# Patient Record
Sex: Female | Born: 1990 | Race: Asian | Hispanic: No | Smoking: Never smoker
Health system: Southern US, Community
[De-identification: ages and names within clinical notes are randomized; demographics above are authoritative.]

## PROBLEM LIST (undated history)

## (undated) DIAGNOSIS — A150 Tuberculosis of lung: Secondary | ICD-10-CM

## (undated) HISTORY — PX: NO PAST SURGERIES: SHX2092

## (undated) HISTORY — DX: Tuberculosis of lung: A15.0

---

## 2011-11-19 DIAGNOSIS — A150 Tuberculosis of lung: Secondary | ICD-10-CM

## 2011-11-19 HISTORY — DX: Tuberculosis of lung: A15.0

## 2018-11-18 NOTE — L&D Delivery Note (Addendum)
OB/GYN Faculty Practice Delivery Note  Katie Whitney is a 28 y.o. G1P1001 s/p forceps assisted vaginal delivery at [redacted]w[redacted]d. She was admitted for PD IOL.   ROM: 15h 35m with clear fluid GBS Status:  Positive (08/06 0000), adequately treated with penicillin followed by ampicillin for chorioamnionitis treatment Maximum Maternal Temperature: 101.4 F    Labor Progress: . Patient arrived at fingertip dilation and was induced with misoprostol, a foley bulb, pitocin, and AROM. She was ruptured for approximately 18 hours and had been on pitocin for 29 hours by the time of delivery. Intrapartum she also developed chorioamnionitis and received ampicillin and gentamicin.  Delivery Date/Time: 07/15/2019 at 1757 Delivery: Patient pushed for approximately 4 hours and was able to achieve 3+ station of the BPD, LOA and EFW approximately 3300gm and pelvis felt adequate. R/B/A d/w her and forceps delivery offered to expedite delivery given amount of time pushing and with chorio. Patient was offered forceps assisted delivery vs cesarean and elected for forceps delivery. Patient provided verbal consent with iPad interpreter used. Anesthesia bolused her epidural. Dr. Ilda Basset then easily applied Simpson-Luikart forceps to infants head. Head delivered with one pull and subsequently shoulder and body delivered in usual fashion. Infant with spontaneous cry, placed on mother's abdomen, dried and stimulated. Cord clamped x 2 after 1-minute delay, and cut by Dr. Ilda Basset. Cord blood drawn. Placenta delivered spontaneously with gentle cord traction. Fundus firm with massage and Pitocin. Rectum, labia, perineum, vagina, and cervix inspected with 3a perineal laceration and repaired in usual fashion with 1-0 vicryl for the EAS repair and 1-0 and 2-0 vicryl used to repair the remaining laceration. End rectal exam negative Will give dose of Unasyn given 3rd degree repair.  Placenta: 3v, intact Complications: forceps assisted  delivery Lacerations: 3a and bilateral sulcal (small on the left) and approximately 10cm on the left EBL: 668 Analgesia: epidural   Infant: APGAR (1 MIN): 8   APGAR (5 MINS): 9    Weight: 3175gm  Augustin Coupe, MD/MPH OB/GYN Fellow, Faculty Practice   Agree with above. I was present for the entire procedure.   Durene Romans MD Attending Center for [employer] farm manager)

## 2019-01-07 ENCOUNTER — Other Ambulatory Visit (HOSPITAL_COMMUNITY): Payer: Self-pay | Admitting: Nurse Practitioner

## 2019-01-07 DIAGNOSIS — Z3A13 13 weeks gestation of pregnancy: Secondary | ICD-10-CM

## 2019-01-07 DIAGNOSIS — Z369 Encounter for antenatal screening, unspecified: Secondary | ICD-10-CM

## 2019-01-07 LAB — OB RESULTS CONSOLE RUBELLA ANTIBODY, IGM: Rubella: IMMUNE

## 2019-01-07 LAB — OB RESULTS CONSOLE GC/CHLAMYDIA
Chlamydia: NEGATIVE
Gonorrhea: NEGATIVE

## 2019-01-07 LAB — OB RESULTS CONSOLE HIV ANTIBODY (ROUTINE TESTING): HIV: NONREACTIVE

## 2019-01-07 LAB — OB RESULTS CONSOLE RPR: RPR: NONREACTIVE

## 2019-01-07 LAB — OB RESULTS CONSOLE ABO/RH: RH Type: POSITIVE

## 2019-01-07 LAB — OB RESULTS CONSOLE HEPATITIS B SURFACE ANTIGEN: Hepatitis B Surface Ag: NEGATIVE

## 2019-01-28 ENCOUNTER — Encounter (HOSPITAL_COMMUNITY): Payer: Self-pay | Admitting: *Deleted

## 2019-01-29 ENCOUNTER — Other Ambulatory Visit: Payer: Self-pay

## 2019-01-29 ENCOUNTER — Ambulatory Visit (HOSPITAL_COMMUNITY): Payer: Medicaid Other | Admitting: *Deleted

## 2019-01-29 ENCOUNTER — Other Ambulatory Visit (HOSPITAL_COMMUNITY): Payer: Self-pay | Admitting: *Deleted

## 2019-01-29 ENCOUNTER — Encounter (HOSPITAL_COMMUNITY): Payer: Self-pay

## 2019-01-29 ENCOUNTER — Ambulatory Visit (HOSPITAL_COMMUNITY)
Admission: RE | Admit: 2019-01-29 | Discharge: 2019-01-29 | Disposition: A | Discharge status: Home / Self Care | Payer: Medicaid Other | Source: Ambulatory Visit | Attending: Obstetrics and Gynecology | Admitting: Obstetrics and Gynecology

## 2019-01-29 ENCOUNTER — Other Ambulatory Visit (HOSPITAL_COMMUNITY): Payer: Self-pay | Admitting: Nurse Practitioner

## 2019-01-29 ENCOUNTER — Encounter (HOSPITAL_COMMUNITY): Payer: Self-pay | Admitting: *Deleted

## 2019-01-29 ENCOUNTER — Ambulatory Visit (HOSPITAL_COMMUNITY): Payer: Medicaid Other

## 2019-01-29 VITALS — BP 119/70 | HR 87 | Ht <= 58 in | Wt 181.0 lb

## 2019-01-29 DIAGNOSIS — Z363 Encounter for antenatal screening for malformations: Secondary | ICD-10-CM | POA: Diagnosis not present

## 2019-01-29 DIAGNOSIS — Z3A13 13 weeks gestation of pregnancy: Secondary | ICD-10-CM

## 2019-01-29 DIAGNOSIS — Z3A16 16 weeks gestation of pregnancy: Secondary | ICD-10-CM

## 2019-01-29 DIAGNOSIS — Z3689 Encounter for other specified antenatal screening: Secondary | ICD-10-CM

## 2019-01-29 DIAGNOSIS — O99211 Obesity complicating pregnancy, first trimester: Secondary | ICD-10-CM | POA: Diagnosis not present

## 2019-01-29 DIAGNOSIS — Z369 Encounter for antenatal screening, unspecified: Secondary | ICD-10-CM

## 2019-01-29 DIAGNOSIS — Z3687 Encounter for antenatal screening for uncertain dates: Secondary | ICD-10-CM | POA: Diagnosis not present

## 2019-01-29 DIAGNOSIS — Z362 Encounter for other antenatal screening follow-up: Secondary | ICD-10-CM

## 2019-02-26 ENCOUNTER — Ambulatory Visit (HOSPITAL_COMMUNITY): Payer: Medicaid Other

## 2019-03-26 ENCOUNTER — Ambulatory Visit (HOSPITAL_COMMUNITY): Payer: Medicaid Other

## 2019-03-26 ENCOUNTER — Encounter (HOSPITAL_COMMUNITY): Payer: Self-pay

## 2019-06-11 ENCOUNTER — Other Ambulatory Visit: Payer: Self-pay

## 2019-06-11 DIAGNOSIS — Z20822 Contact with and (suspected) exposure to covid-19: Secondary | ICD-10-CM

## 2019-06-15 LAB — NOVEL CORONAVIRUS, NAA: SARS-CoV-2, NAA: NOT DETECTED

## 2019-06-24 LAB — OB RESULTS CONSOLE GC/CHLAMYDIA
Chlamydia: NEGATIVE
Gonorrhea: NEGATIVE

## 2019-06-24 LAB — OB RESULTS CONSOLE GBS: GBS: POSITIVE

## 2019-07-09 ENCOUNTER — Other Ambulatory Visit (HOSPITAL_COMMUNITY): Payer: Self-pay | Admitting: Family

## 2019-07-09 DIAGNOSIS — O480 Post-term pregnancy: Secondary | ICD-10-CM

## 2019-07-12 ENCOUNTER — Encounter (HOSPITAL_COMMUNITY): Payer: Self-pay | Admitting: *Deleted

## 2019-07-12 ENCOUNTER — Telehealth (HOSPITAL_COMMUNITY): Payer: Self-pay | Admitting: *Deleted

## 2019-07-12 NOTE — Telephone Encounter (Signed)
Preadmission screen  

## 2019-07-13 ENCOUNTER — Ambulatory Visit (HOSPITAL_COMMUNITY): Payer: Medicaid Other | Admitting: *Deleted

## 2019-07-13 ENCOUNTER — Inpatient Hospital Stay (HOSPITAL_COMMUNITY)
Admission: AD | Admit: 2019-07-13 | Discharge: 2019-07-17 | DRG: 768 | Disposition: A | Discharge status: Home / Self Care | Payer: Medicaid Other | Attending: Family Medicine | Admitting: Family Medicine

## 2019-07-13 ENCOUNTER — Other Ambulatory Visit (HOSPITAL_COMMUNITY): Payer: Self-pay | Admitting: Family

## 2019-07-13 ENCOUNTER — Ambulatory Visit (HOSPITAL_COMMUNITY)
Admission: RE | Admit: 2019-07-13 | Discharge: 2019-07-13 | Disposition: A | Discharge status: Home / Self Care | Payer: Medicaid Other | Source: Ambulatory Visit | Attending: Obstetrics and Gynecology | Admitting: Obstetrics and Gynecology

## 2019-07-13 ENCOUNTER — Other Ambulatory Visit: Payer: Self-pay

## 2019-07-13 ENCOUNTER — Encounter (HOSPITAL_COMMUNITY): Payer: Self-pay

## 2019-07-13 DIAGNOSIS — Z8611 Personal history of tuberculosis: Secondary | ICD-10-CM | POA: Diagnosis not present

## 2019-07-13 DIAGNOSIS — O480 Post-term pregnancy: Secondary | ICD-10-CM

## 2019-07-13 DIAGNOSIS — Z3A40 40 weeks gestation of pregnancy: Secondary | ICD-10-CM

## 2019-07-13 DIAGNOSIS — O411230 Chorioamnionitis, third trimester, not applicable or unspecified: Secondary | ICD-10-CM | POA: Diagnosis present

## 2019-07-13 DIAGNOSIS — O99214 Obesity complicating childbirth: Secondary | ICD-10-CM | POA: Diagnosis present

## 2019-07-13 DIAGNOSIS — O411290 Chorioamnionitis, unspecified trimester, not applicable or unspecified: Secondary | ICD-10-CM | POA: Diagnosis not present

## 2019-07-13 DIAGNOSIS — O409XX0 Polyhydramnios, unspecified trimester, not applicable or unspecified: Secondary | ICD-10-CM

## 2019-07-13 DIAGNOSIS — O289 Unspecified abnormal findings on antenatal screening of mother: Secondary | ICD-10-CM | POA: Diagnosis not present

## 2019-07-13 DIAGNOSIS — O99824 Streptococcus B carrier state complicating childbirth: Secondary | ICD-10-CM | POA: Diagnosis present

## 2019-07-13 DIAGNOSIS — Z20828 Contact with and (suspected) exposure to other viral communicable diseases: Secondary | ICD-10-CM | POA: Diagnosis present

## 2019-07-13 LAB — TYPE AND SCREEN
ABO/RH(D): A POS
Antibody Screen: NEGATIVE

## 2019-07-13 LAB — ABO/RH: ABO/RH(D): A POS

## 2019-07-13 LAB — CBC
HCT: 32.5 % — ABNORMAL LOW (ref 36.0–46.0)
Hemoglobin: 10.6 g/dL — ABNORMAL LOW (ref 12.0–15.0)
MCH: 29.9 pg (ref 26.0–34.0)
MCHC: 32.6 g/dL (ref 30.0–36.0)
MCV: 91.8 fL (ref 80.0–100.0)
Platelets: 251 10*3/uL (ref 150–400)
RBC: 3.54 MIL/uL — ABNORMAL LOW (ref 3.87–5.11)
RDW: 13.4 % (ref 11.5–15.5)
WBC: 9.2 10*3/uL (ref 4.0–10.5)
nRBC: 0 % (ref 0.0–0.2)

## 2019-07-13 LAB — SARS CORONAVIRUS 2 (TAT 6-24 HRS): SARS Coronavirus 2: NEGATIVE

## 2019-07-13 MED ORDER — PENICILLIN G 3 MILLION UNITS IVPB - SIMPLE MED
3.0000 10*6.[IU] | INTRAVENOUS | Status: DC
  Administered 2019-07-13 – 2019-07-15 (×11): 3 10*6.[IU] via INTRAVENOUS
  Filled 2019-07-13 (×11): qty 100

## 2019-07-13 MED ORDER — OXYTOCIN BOLUS FROM INFUSION
500.0000 mL | Freq: Once | INTRAVENOUS | Status: AC
  Administered 2019-07-15: 18:00:00 500 mL via INTRAVENOUS

## 2019-07-13 MED ORDER — LACTATED RINGERS IV SOLN
INTRAVENOUS | Status: DC
  Administered 2019-07-13 – 2019-07-15 (×7): via INTRAVENOUS

## 2019-07-13 MED ORDER — TERBUTALINE SULFATE 1 MG/ML IJ SOLN: 0.2500 mg | Freq: Once | INTRAMUSCULAR | Status: DC | PRN

## 2019-07-13 MED ORDER — OXYCODONE-ACETAMINOPHEN 5-325 MG PO TABS: 2.0000 | ORAL_TABLET | ORAL | Status: DC | PRN

## 2019-07-13 MED ORDER — ONDANSETRON HCL 4 MG/2ML IJ SOLN: 4.0000 mg | Freq: Four times a day (QID) | INTRAMUSCULAR | Status: DC | PRN

## 2019-07-13 MED ORDER — ACETAMINOPHEN 325 MG PO TABS
650.0000 mg | ORAL_TABLET | ORAL | Status: DC | PRN
  Administered 2019-07-15: 650 mg via ORAL
  Filled 2019-07-13: qty 2

## 2019-07-13 MED ORDER — SODIUM CHLORIDE 0.9 % IV SOLN
5.0000 10*6.[IU] | Freq: Once | INTRAVENOUS | Status: AC
  Administered 2019-07-13: 16:00:00 5 10*6.[IU] via INTRAVENOUS
  Filled 2019-07-13: qty 5

## 2019-07-13 MED ORDER — LIDOCAINE HCL (PF) 1 % IJ SOLN
30.0000 mL | INTRAMUSCULAR | Status: AC | PRN
  Administered 2019-07-15: 18:00:00 30 mL via SUBCUTANEOUS
  Filled 2019-07-13: qty 30

## 2019-07-13 MED ORDER — OXYCODONE-ACETAMINOPHEN 5-325 MG PO TABS: 1.0000 | ORAL_TABLET | ORAL | Status: DC | PRN

## 2019-07-13 MED ORDER — LACTATED RINGERS IV SOLN
500.0000 mL | INTRAVENOUS | Status: DC | PRN
  Administered 2019-07-14: 18:00:00 500 mL via INTRAVENOUS
  Administered 2019-07-15 (×2): 1000 mL via INTRAVENOUS

## 2019-07-13 MED ORDER — SOD CITRATE-CITRIC ACID 500-334 MG/5ML PO SOLN: 30.0000 mL | ORAL | Status: DC | PRN

## 2019-07-13 MED ORDER — MISOPROSTOL 50MCG HALF TABLET
50.0000 ug | ORAL_TABLET | ORAL | Status: DC | PRN
  Administered 2019-07-13 – 2019-07-14 (×3): 50 ug via BUCCAL
  Filled 2019-07-13 (×3): qty 1

## 2019-07-13 MED ORDER — MISOPROSTOL 50MCG HALF TABLET
ORAL_TABLET | ORAL | Status: AC
  Administered 2019-07-13: 16:00:00 50 ug
  Filled 2019-07-13: qty 1

## 2019-07-13 MED ORDER — OXYTOCIN 40 UNITS IN NORMAL SALINE INFUSION - SIMPLE MED
2.5000 [IU]/h | INTRAVENOUS | Status: DC
  Administered 2019-07-15: 18:00:00 500 [IU]/h via INTRAVENOUS
  Filled 2019-07-13 (×2): qty 1000

## 2019-07-13 NOTE — Progress Notes (Signed)
Dr. Gertie Exon called report to Dr. Nehemiah Settle. Patient to be admitted for delivery.

## 2019-07-13 NOTE — Progress Notes (Signed)
Patient ID: Katie Whitney, female   DOB: [birth year], 28 y.o.   MRN: [id number] Katie Whitney is a 28 y.o. G1P0 at [redacted]w[redacted]d admitted for induction of labor due to postdates, BPP 8/10 (-2 for tone).  Subjective: Comfortable, no complaints  Objective: BP 132/85   Pulse (!) 106   Temp 98.5 F (36.9 C) (Oral)   Resp 16   Ht 5\' 1"  (1.549 m)   Wt 96 kg   LMP 10/29/2018 (Exact Date)   BMI 40.00 kg/m  No intake/output data recorded.  FHT:  FHR: 135 bpm, variability: moderate,  accelerations:  Present,  decelerations:  Absent UC:   q 2-75mins  SVE:   Dilation: Fingertip Effacement (%): Thick Station: -3 Exam by:: Dr. Mathis Dad  Labs: Lab Results  Component Value Date   WBC 9.2 07/13/2019   HGB 10.6 (L) 07/13/2019   HCT 32.5 (L) 07/13/2019   MCV 91.8 07/13/2019   PLT 251 07/13/2019    Assessment / Plan: IOL d/t postdates w/ BPP 8/10 (-2 for tone), s/p cytotec x1, almost due for next dose. Plan foley bulb when able  Labor: ripening phase Fetal Wellbeing:  Category I Pain Control:  n/a Pre-eclampsia: n/a I/D:  GBS+ PCN Anticipated MOD:  NSVD  [name] CNM, WHNP-BC 07/13/2019, 2120

## 2019-07-13 NOTE — H&P (Signed)
OBSTETRIC ADMISSION HISTORY AND PHYSICAL  Nepali interpreter used during this encounter.  Katie Whitney is a 28 y.o. female G1P0 with IUP at 5679w3d presenting for IOL secondary to post-dates and BPP 8/10 (no tone seen). She reports +FMs. No LOF, VB, blurry vision, headaches, peripheral edema, or RUQ pain. She plans on breastfeeding. She requests OCPs for birth control.  Dating: By first trimester US --->  Estimated Date of Delivery: 07/10/19  Sono:   @[redacted]w[redacted]d , CWD, normal anatomy, cephalic presentation, BPP 8/10  Prenatal History/Complications: Hx + PPD, treated in 2013 Hx COVID exposure, COVID neg 7/27  Past Medical History: Past Medical History:  Diagnosis Date  . TB (pulmonary tuberculosis)    Test was positive. Was treated    Past Surgical History: Past Surgical History:  Procedure Laterality Date  . NO PAST SURGERIES      Obstetrical History: OB History    Gravida  1   Para      Term      Preterm      AB      Living  0     SAB      TAB      Ectopic      Multiple      Live Births              Social History: Social History   Socioeconomic History  . Marital status: Unknown    Spouse name: Not on file  . Number of children: Not on file  . Years of education: Not on file  . Highest education level: Not on file  Occupational History  . Not on file  Social Needs  . Financial resource strain: Not on file  . Food insecurity    Worry: Not on file    Inability: Not on file  . Transportation needs    Medical: Not on file    Non-medical: Not on file  Tobacco Use  . Smoking status: Never Smoker  . Smokeless tobacco: Never Used  Substance and Sexual Activity  . Alcohol use: Never    Frequency: Never  . Drug use: Never  . Sexual activity: Not Currently  Lifestyle  . Physical activity    Days per week: Not on file    Minutes per session: Not on file  . Stress: Not on file  Relationships  . Social Musicianconnections    Talks on phone: Not on file     Gets together: Not on file    Attends religious service: Not on file    Active member of club or organization: Not on file    Attends meetings of clubs or organizations: Not on file    Relationship status: Not on file  Other Topics Concern  . Not on file  Social History Narrative  . Not on file    Family History: Family History  Problem Relation Age of Onset  . Cancer Mother     Allergies: No Known Allergies  Medications Prior to Admission  Medication Sig Dispense Refill Last Dose  . polyethylene glycol (MIRALAX / GLYCOLAX) 17 g packet Take 17 g by mouth daily.   Past Week at Unknown time  . Prenatal Vit-Fe Fumarate-FA (PRENATAL VITAMIN PO) Take by mouth.   07/12/2019 at Unknown time  . acetaminophen (TYLENOL) 325 MG tablet Take 650 mg by mouth every 6 (six) hours as needed.   More than a month at Unknown time     Review of Systems   All systems reviewed and  negative except as stated in HPI  Blood pressure 121/82, pulse (!) 121, temperature 98.5 F (36.9 C), temperature source Oral, resp. rate 16, height 5\' 1"  (1.549 m), weight 96 kg, last menstrual period 10/29/2018. General appearance: alert, cooperative and no distress Lungs: regular rate and effort Heart: regular rate  Abdomen: soft, non-tender Extremities: Homans sign is negative, no sign of DVT Presentation: cephalic Fetal monitoringBaseline: 140 bpm, Variability: Good {> 6 bpm), Accelerations: Reactive and Decelerations: Absent Uterine activityNone Dilation: Fingertip Effacement (%): Thick Station: -3 Exam by:: Dr. Mathis Dad   Prenatal labs: ABO, Rh:  A+ Antibody:  negative Rubella:  immune RPR:   negative HBsAg:   negative HIV:   nonreactive GBS:   positive DM screen negative  Prenatal Transfer Tool  Maternal Diabetes: No Genetic Screening: Normal Maternal Ultrasounds/Referrals: Normal Fetal Ultrasounds or other Referrals:  Other:  BPP 8/10 on 07/13/19 Maternal Substance Abuse:   No Significant Maternal Medications:  None Significant Maternal Lab Results: Group B Strep positive  Results for orders placed or performed during the hospital encounter of 07/13/19 (from the past 24 hour(s))  CBC   Collection Time: 07/13/19  3:54 PM  Result Value Ref Range   WBC 9.2 4.0 - 10.5 K/uL   RBC 3.54 (L) 3.87 - 5.11 MIL/uL   Hemoglobin 10.6 (L) 12.0 - 15.0 g/dL   HCT 32.5 (L) 36.0 - 46.0 %   MCV 91.8 80.0 - 100.0 fL   MCH 29.9 26.0 - 34.0 pg   MCHC 32.6 30.0 - 36.0 g/dL   RDW 13.4 11.5 - 15.5 %   Platelets 251 150 - 400 K/uL   nRBC 0.0 0.0 - 0.2 %    Patient Active Problem List   Diagnosis Date Noted  . Post term pregnancy over 40 weeks 07/13/2019    Assessment and Plan: Katie Whitney is a 28 y.o. G1P0 at [redacted]w[redacted]d here for IOL secondary to post-dates and BPP 8/10.  1. Labor: start with cytotec, consider Foley balloon/pitocin/AROM as needed, time appropriate cervical examinations 2. FWB: fetal status reassuring, continuous monitoring 3. Pain: epidural as needed 4. GBS: positive  Risks and benefits of induction were reviewed, including failure of method, prolonged labor, need for further intervention, risk of cesarean.  Patient and family seem to understand these risks and wish to proceed. Options of cytotec, foley bulb, AROM, and pitocin reviewed, with use of each discussed.  Anticipate cervical ripening, vaginal delivery. CS as appropriate  [name], DO  07/13/2019, 4:12 PM

## 2019-07-13 NOTE — Procedures (Signed)
Katie Whitney 02-14-1991 [redacted]w[redacted]d  Fetus A Non-Stress Test Interpretation for 07/13/19  Indication: Unsatisfactory BPP, postdates  Fetal Heart Rate A Mode: External Baseline Rate (A): 140 bpm Variability: Moderate Accelerations: 15 x 15 Decelerations: None Multiple birth?: No  Uterine Activity Mode: Palpation, Toco Contraction Frequency (min): Occas Contraction Quality: Mild Resting Tone Palpated: Relaxed Resting Time: Adequate  Interpretation (Fetal Testing) Nonstress Test Interpretation: Reactive Comments: EFM tracing reviewed by Dr. Gertie Exon

## 2019-07-14 LAB — RPR: RPR Ser Ql: NONREACTIVE

## 2019-07-14 MED ORDER — DIPHENHYDRAMINE HCL 50 MG/ML IJ SOLN: 12.5000 mg | INTRAMUSCULAR | Status: DC | PRN

## 2019-07-14 MED ORDER — FENTANYL CITRATE (PF) 100 MCG/2ML IJ SOLN
INTRAMUSCULAR | Status: AC
  Filled 2019-07-14: qty 2

## 2019-07-14 MED ORDER — PHENYLEPHRINE 40 MCG/ML (10ML) SYRINGE FOR IV PUSH (FOR BLOOD PRESSURE SUPPORT)
80.0000 ug | PREFILLED_SYRINGE | INTRAVENOUS | Status: DC | PRN
  Filled 2019-07-14: qty 10

## 2019-07-14 MED ORDER — EPHEDRINE 5 MG/ML INJ: 10.0000 mg | INTRAVENOUS | Status: DC | PRN

## 2019-07-14 MED ORDER — PHENYLEPHRINE 40 MCG/ML (10ML) SYRINGE FOR IV PUSH (FOR BLOOD PRESSURE SUPPORT): 80.0000 ug | PREFILLED_SYRINGE | INTRAVENOUS | Status: DC | PRN

## 2019-07-14 MED ORDER — OXYTOCIN 40 UNITS IN NORMAL SALINE INFUSION - SIMPLE MED
1.0000 m[IU]/min | INTRAVENOUS | Status: DC
  Administered 2019-07-14: 13:00:00 2 m[IU]/min via INTRAVENOUS

## 2019-07-14 MED ORDER — FENTANYL-BUPIVACAINE-NACL 0.5-0.125-0.9 MG/250ML-% EP SOLN
12.0000 mL/h | EPIDURAL | Status: DC | PRN
  Filled 2019-07-14: qty 250

## 2019-07-14 MED ORDER — LACTATED RINGERS IV SOLN: 500.0000 mL | Freq: Once | INTRAVENOUS | Status: DC

## 2019-07-14 MED ORDER — FENTANYL CITRATE (PF) 100 MCG/2ML IJ SOLN
100.0000 ug | INTRAMUSCULAR | Status: DC | PRN
  Administered 2019-07-14 – 2019-07-15 (×3): 100 ug via INTRAVENOUS
  Filled 2019-07-14 (×2): qty 2

## 2019-07-14 NOTE — Progress Notes (Signed)
Katie Whitney is a 28 y.o. G1P0 at [redacted]w[redacted]d by ultrasound admitted for induction of labor due to BPP 8/10 (no tone seen) Post dates. Due date 07/10/19  Subjective: Pt doing well, no new concerns.   Objective: BP 131/79   Pulse 100   Temp 98.7 F (37.1 C) (Oral)   Resp 16   Ht 5\' 1"  (1.549 m)   Wt 96 kg   LMP 10/29/2018 (Exact Date)   BMI 40.00 kg/m  No intake/output data recorded. No intake/output data recorded.  FHT:  FHR: 140 bpm, variability: moderate,  accelerations:  Present,  decelerations:  Absent UC:   regular, every 1.5-3 minutes SVE:   Dilation: 5.5 Effacement (%): 50 Station: -3 Exam by:: Epifanio Lesches, RNC  Labs: Lab Results  Component Value Date   WBC 9.2 07/13/2019   HGB 10.6 (L) 07/13/2019   HCT 32.5 (L) 07/13/2019   MCV 91.8 07/13/2019   PLT 251 07/13/2019    Assessment / Plan: Induction of labor due to postterm,  progressing well on pitocin  Labor: Progressing on Pitocin, will continue to increase then AROM Preeclampsia:  no signs or symptoms of toxicity Fetal Wellbeing:  Category I Pain Control:  IV pain meds I/D:  GBS positive  Anticipated MOD:  NSVD  Lattie Haw MD PGY-1, [facility]  07/14/2019, 9:54 PM

## 2019-07-14 NOTE — Progress Notes (Signed)
Patient ID: Katie Whitney, female   DOB: 1990-11-20, 28 y.o.   MRN: [id number] Katie Whitney is a 28 y.o. G1P0 at [redacted]w[redacted]d admitted for induction of labor due to bpp 8/10.  Subjective: cramping  Objective: BP 130/85   Pulse (!) 101   Temp 98.6 F (37 C) (Oral)   Resp 16   Ht 5\' 1"  (1.549 m)   Wt 96 kg   LMP 10/29/2018 (Exact Date)   BMI 40.00 kg/m  No intake/output data recorded.  FHT:  FHR: 135 bpm, variability: moderate,  accelerations:  Present,  decelerations:  Absent UC:   q 2-60mins  SVE:   Dilation: 1 Effacement (%): 40 Station: -3 Exam by:: Knute Neu, CNM  Cervical foley bulb inserted and inflated w/ 55ml LR w/o difficulty   Labs: Lab Results  Component Value Date   WBC 9.2 07/13/2019   HGB 10.6 (L) 07/13/2019   HCT 32.5 (L) 07/13/2019   MCV 91.8 07/13/2019   PLT 251 07/13/2019    Assessment / Plan: IOL d/t BPP 8/10, s/p cytotec x 3, cervical foley bulb placed, continue cytotec until foley bulb out, then plan pitocin per protocol  Labor: ripening phase Fetal Wellbeing:  Category I Pain Control:  n/a Pre-eclampsia: n/a I/D:  PCN for GBS+ Anticipated MOD:  NSVD  [name] CNM, WHNP-BC 07/14/2019, 6:09 AM

## 2019-07-15 ENCOUNTER — Encounter (HOSPITAL_COMMUNITY): Payer: Self-pay | Admitting: *Deleted

## 2019-07-15 ENCOUNTER — Other Ambulatory Visit (HOSPITAL_COMMUNITY)
Admission: RE | Admit: 2019-07-15 | Discharge: 2019-07-15 | Disposition: A | Discharge status: Home / Self Care | Payer: Medicaid Other | Source: Ambulatory Visit | Attending: Family Medicine | Admitting: Family Medicine

## 2019-07-15 ENCOUNTER — Inpatient Hospital Stay (HOSPITAL_COMMUNITY): Payer: Medicaid Other | Admitting: Anesthesiology

## 2019-07-15 DIAGNOSIS — Z3A40 40 weeks gestation of pregnancy: Secondary | ICD-10-CM

## 2019-07-15 DIAGNOSIS — O480 Post-term pregnancy: Secondary | ICD-10-CM

## 2019-07-15 DIAGNOSIS — O99824 Streptococcus B carrier state complicating childbirth: Secondary | ICD-10-CM

## 2019-07-15 DIAGNOSIS — O411290 Chorioamnionitis, unspecified trimester, not applicable or unspecified: Secondary | ICD-10-CM | POA: Diagnosis not present

## 2019-07-15 MED ORDER — COCONUT OIL OIL: 1.0000 "application " | TOPICAL_OIL | Status: DC | PRN

## 2019-07-15 MED ORDER — POLYETHYLENE GLYCOL 3350 17 G PO PACK
17.0000 g | PACK | Freq: Every day | ORAL | Status: DC
  Administered 2019-07-16 – 2019-07-17 (×2): 17 g via ORAL
  Filled 2019-07-15 (×2): qty 1

## 2019-07-15 MED ORDER — PRENATAL MULTIVITAMIN CH
1.0000 | ORAL_TABLET | Freq: Every day | ORAL | Status: DC
  Administered 2019-07-16 – 2019-07-17 (×2): 1 via ORAL
  Filled 2019-07-15 (×2): qty 1

## 2019-07-15 MED ORDER — BENZOCAINE-MENTHOL 20-0.5 % EX AERO: 1.0000 "application " | INHALATION_SPRAY | CUTANEOUS | Status: DC | PRN

## 2019-07-15 MED ORDER — SODIUM CHLORIDE 0.9 % IV SOLN
2.0000 g | Freq: Four times a day (QID) | INTRAVENOUS | Status: DC
  Administered 2019-07-15: 2 g via INTRAVENOUS
  Filled 2019-07-15 (×2): qty 2000

## 2019-07-15 MED ORDER — OXYCODONE HCL 5 MG PO TABS: 5.0000 mg | ORAL_TABLET | ORAL | Status: DC | PRN

## 2019-07-15 MED ORDER — DIPHENHYDRAMINE HCL 25 MG PO CAPS: 25.0000 mg | ORAL_CAPSULE | Freq: Four times a day (QID) | ORAL | Status: DC | PRN

## 2019-07-15 MED ORDER — GENTAMICIN SULFATE 40 MG/ML IJ SOLN
5.0000 mg/kg | Freq: Once | INTRAVENOUS | Status: AC
  Administered 2019-07-15: 15:00:00 340 mg via INTRAVENOUS
  Filled 2019-07-15: qty 8.5

## 2019-07-15 MED ORDER — MAGNESIUM HYDROXIDE 400 MG/5ML PO SUSP: 30.0000 mL | ORAL | Status: DC | PRN

## 2019-07-15 MED ORDER — LIDOCAINE-EPINEPHRINE (PF) 2 %-1:200000 IJ SOLN
INTRAMUSCULAR | Status: AC
  Filled 2019-07-15: qty 10

## 2019-07-15 MED ORDER — SODIUM CHLORIDE (PF) 0.9 % IJ SOLN
INTRAMUSCULAR | Status: DC | PRN
  Administered 2019-07-15: 12 mL/h via EPIDURAL

## 2019-07-15 MED ORDER — LIDOCAINE HCL (PF) 1 % IJ SOLN
INTRAMUSCULAR | Status: DC | PRN
  Administered 2019-07-15: 10 mL via EPIDURAL

## 2019-07-15 MED ORDER — SIMETHICONE 80 MG PO CHEW: 80.0000 mg | CHEWABLE_TABLET | ORAL | Status: DC | PRN

## 2019-07-15 MED ORDER — SODIUM CHLORIDE 0.9 % IV SOLN
3.0000 g | Freq: Once | INTRAVENOUS | Status: AC
  Administered 2019-07-15: 20:00:00 3 g via INTRAVENOUS
  Filled 2019-07-15: qty 8

## 2019-07-15 MED ORDER — ONDANSETRON HCL 4 MG/2ML IJ SOLN: 4.0000 mg | INTRAMUSCULAR | Status: DC | PRN

## 2019-07-15 MED ORDER — ONDANSETRON HCL 4 MG PO TABS: 4.0000 mg | ORAL_TABLET | ORAL | Status: DC | PRN

## 2019-07-15 MED ORDER — IBUPROFEN 600 MG PO TABS
600.0000 mg | ORAL_TABLET | Freq: Four times a day (QID) | ORAL | Status: DC
  Administered 2019-07-15 – 2019-07-17 (×7): 600 mg via ORAL
  Filled 2019-07-15 (×7): qty 1

## 2019-07-15 MED ORDER — OXYCODONE HCL 5 MG PO TABS: 10.0000 mg | ORAL_TABLET | ORAL | Status: DC | PRN

## 2019-07-15 MED ORDER — WITCH HAZEL-GLYCERIN EX PADS: 1.0000 "application " | MEDICATED_PAD | CUTANEOUS | Status: DC | PRN

## 2019-07-15 MED ORDER — SODIUM CHLORIDE 0.9 % IV SOLN: 1.0000 g | Freq: Four times a day (QID) | INTRAVENOUS | Status: DC

## 2019-07-15 MED ORDER — LIDOCAINE-EPINEPHRINE (PF) 2 %-1:200000 IJ SOLN
INTRAMUSCULAR | Status: DC | PRN
  Administered 2019-07-15: 5 mL via INTRADERMAL

## 2019-07-15 MED ORDER — DIBUCAINE (PERIANAL) 1 % EX OINT: 1.0000 "application " | TOPICAL_OINTMENT | CUTANEOUS | Status: DC | PRN

## 2019-07-15 MED ORDER — ACETAMINOPHEN 325 MG PO TABS: 650.0000 mg | ORAL_TABLET | ORAL | Status: DC | PRN

## 2019-07-15 MED ORDER — SENNOSIDES-DOCUSATE SODIUM 8.6-50 MG PO TABS
2.0000 | ORAL_TABLET | ORAL | Status: DC
  Administered 2019-07-15 – 2019-07-16 (×2): 2 via ORAL
  Filled 2019-07-15 (×2): qty 2

## 2019-07-15 NOTE — Progress Notes (Signed)
LABOR PROGRESS NOTE  Katie Whitney is a 28 y.o. G1P0000 at [redacted]w[redacted]d  admitted for PD IOL. Pregnancy complicated by n/a.   Subjective: Epidural working well Not feeling much  Objective: BP 127/74   Pulse (!) 119   Temp 98.7 F (37.1 C) (Oral)   Resp 18   Ht 5\' 1"  (1.549 m)   Wt 96 kg   LMP 10/29/2018 (Exact Date)   SpO2 100%   BMI 40.00 kg/m  or  Vitals:   07/15/19 0831 07/15/19 0901 07/15/19 0931 07/15/19 1001  BP: 115/70 108/63 129/73 127/74  Pulse: (!) 106 (!) 113 (!) 114 (!) 119  Resp: 20 20 18 18   Temp: 98.8 F (37.1 C)   98.7 F (37.1 C)  TempSrc: Oral   Oral  SpO2:      Weight:      Height:         Dilation: 9 Effacement (%): 100 Cervical Position: Anterior Station: Plus 1 Presentation: Vertex Exam by:: Curly [place], RN FHT: baseline rate 150, moderate varibility, +acel, -decel Toco: q3 min ctx  Labs: Lab Results  Component Value Date   WBC 9.2 07/13/2019   HGB 10.6 (L) 07/13/2019   HCT 32.5 (L) 07/13/2019   MCV 91.8 07/13/2019   PLT 251 07/13/2019    Patient Active Problem List   Diagnosis Date Noted  . Post term pregnancy over 40 weeks 07/13/2019    Assessment / Plan: 28 y.o. G1P0000 at [redacted]w[redacted]d here for PD IOL. Pregnancy complicated by n/a.  Labor: s/p Miso x4, FB, and on Pit since 8/26 @ 1300 (21 hours at this point). Progressing well, now 9 cm and 1+ station, cont pitocin. Fetal Wellbeing:  Cat I, reassuring Pain Control:  epidural Anticipated MOD:  SVD  Augustin Coupe, MD/MP[facility] OB Fellow  07/15/2019, 10:05 AM

## 2019-07-15 NOTE — Discharge Summary (Signed)
Postpartum Discharge Summary     Patient Name: Katie Whitney DOB: 11-06-1991 MRN: [id number]  Date of admission: 07/13/2019 Delivering Provider: Aletha Halim   Date of discharge: 07/17/2019  Admitting diagnosis: Induction Intrauterine pregnancy: [redacted]w[redacted]d     Secondary diagnosis:  Active Problems:   Post term pregnancy over 40 weeks   Chorioamnionitis   Type 3a perineal laceration during delivery   Forceps delivery  Additional problems:  Limited prenatal care Chorioamnionitis     Discharge diagnosis: Term Pregnancy Delivered                                                                                                Post partum procedures:N/A  Augmentation: AROM, Pitocin, Cytotec and Foley Balloon  Complications: N/A  Hospital course:  Induction of Labor With Vaginal Delivery   28 y.o. yo G1P1001 at [redacted]w[redacted]d was admitted to the hospital 07/13/2019 for induction of labor.  Indication for induction: Postdates.  Patient's labor induced with misoprostol, foley balloon, pitocin, and AROM. Labor complicated by chorioamnionitis for which she received antibiotics. Patient pushed for 4.5 hours reaching 3+station, at which she was offered forceps delivery which was successful. Membrane Rupture Time/Date: 2:23 AM ,07/15/2019   Intrapartum Procedures: Episiotomy: None [1]                                         Lacerations:  3rd degree [4]  Patient had delivery of a Viable infant.  Information for the patient's newborn:  Portia, Wisdom [[id number]]  Delivery Method: Vaginal, Forceps(Filed from Delivery Summary)    07/15/2019  Details of delivery can be found in separate delivery note.  Patient had a routine postpartum course. Patient is discharged home 07/15/19.  Magnesium Sulfate recieved: No BMZ received: No  Physical exam  Vitals:   07/15/19 1831 07/15/19 1846 07/15/19 1901 07/15/19 1917  BP: 117/63 112/63 120/63 (!) 66/41  Pulse: 81 83 92 92  Resp: 20 18 18 20   Temp: (!)  100.5 F (38.1 C)     TempSrc: Axillary     SpO2:      Weight:      Height:       General: alert, cooperative and no distress Lochia: appropriate Uterine Fundus: firm Incision: N/A DVT Evaluation: No evidence of DVT seen on physical exam. Labs: Lab Results  Component Value Date   WBC 9.2 07/13/2019   HGB 10.6 (L) 07/13/2019   HCT 32.5 (L) 07/13/2019   MCV 91.8 07/13/2019   PLT 251 07/13/2019   No flowsheet data found.  Discharge instruction: per After Visit Summary and "Baby and Me Booklet".  After visit meds:  Allergies as of 07/17/2019   No Known Allergies     Medication List    TAKE these medications   acetaminophen 325 MG tablet Commonly known as: Tylenol Take 2 tablets (650 mg total) by mouth every 4 (four) hours as needed for mild pain (for pain scale < 4). What changed:   when to take this  reasons to take this   benzocaine-Menthol 20-0.5 % Aero Commonly known as: DERMOPLAST Apply 1 application topically as needed for irritation (perineal discomfort).   dibucaine 1 % Oint Commonly known as: NUPERCAINAL Place 1 application rectally as needed for hemorrhoids.   magnesium hydroxide 400 MG/5ML suspension Commonly known as: MILK OF MAGNESIA Take 30 mLs by mouth every three (3) days as needed for mild constipation (if no BM).   polyethylene glycol 17 g packet Commonly known as: MIRALAX / GLYCOLAX Take 17 g by mouth daily.   PRENATAL VITAMIN PO Take by mouth.   senna-docusate 8.6-50 MG tablet Commonly known as: Senokot-S Take 2 tablets by mouth daily. Start taking on: July 18, 2019   simethicone 80 MG chewable tablet Commonly known as: MYLICON Chew 1 tablet (80 mg total) by mouth as needed for flatulence.   witch hazel-glycerin pad Commonly known as: TUCKS Apply 1 application topically as needed for hemorrhoids.       Diet: routine diet  Activity: Advance as tolerated. Pelvic rest for 6 weeks.   Outpatient follow up:2 weeks Follow up  Appt:No future appointments. Follow up Visit:   Please schedule this patient for Postpartum visit in: 2 weeks with the following provider: Dr. Vergie LivingPickens, 3rd degree perineal laceration For C/S patients schedule nurse incision check in weeks 2 weeks: no Low risk pregnancy complicated by: n/a Delivery mode:  Forceps assisted delivery Anticipated Birth Control:  OCPs PP Procedures needed: follow up perineal 3rd degree  Schedule Integrated BH visit: no  Newborn Data: Live born female  Birth Weight:   APGAR: 8, 9  Newborn Delivery   Birth date/time: 07/15/2019 17:57:00 Delivery type: Vaginal, Forceps      Baby Feeding: Breast Disposition:home with mother  Language barrier: Nepali iPad interpreter used for my interaction with patient today  Clayton BiblesSamantha Navin Dogan, MSN, CNM Certified Nurse Midwife, [facility], [facility] 07/17/19 10:20 AM

## 2019-07-15 NOTE — Anesthesia Procedure Notes (Signed)
Epidural Patient location during procedure: OB Start time: 07/15/2019 4:03 AM End time: 07/15/2019 4:15 AM  Staffing Anesthesiologist: Lidia Collum, MD Performed: anesthesiologist   Preanesthetic Checklist Completed: patient identified, pre-op evaluation, timeout performed, IV checked, risks and benefits discussed and monitors and equipment checked  Epidural Patient position: sitting Prep: DuraPrep Patient monitoring: heart rate, continuous pulse ox and blood pressure Approach: midline Location: L3-L4 Injection technique: LOR air  Needle:  Needle type: Tuohy  Needle gauge: 17 G Needle length: 9 cm Needle insertion depth: 6 cm Catheter type: closed end flexible Catheter size: 19 Gauge Catheter at skin depth: 11 cm Test dose: negative  Assessment Events: blood not aspirated, injection not painful, no injection resistance, negative IV test and no paresthesia  Additional Notes Reason for block:procedure for pain

## 2019-07-15 NOTE — Anesthesia Preprocedure Evaluation (Signed)
Anesthesia Evaluation  Patient identified by MRN, date of birth, ID band Patient awake    Reviewed: Allergy & Precautions, H&P , NPO status , Patient's Chart, lab work & pertinent test results  History of Anesthesia Complications Negative for: history of anesthetic complications  Airway Mallampati: II  TM Distance: >3 FB Neck ROM: full    Dental no notable dental hx.    Pulmonary neg pulmonary ROS,    Pulmonary exam normal        Cardiovascular negative cardio ROS Normal cardiovascular exam Rhythm:regular Rate:Normal     Neuro/Psych negative neurological ROS  negative psych ROS   GI/Hepatic negative GI ROS, Neg liver ROS,   Endo/Other  Morbid obesity  Renal/GU negative Renal ROS  negative genitourinary   Musculoskeletal   Abdominal   Peds  Hematology negative hematology ROS (+)   Anesthesia Other Findings   Reproductive/Obstetrics (+) Pregnancy                             Anesthesia Physical Anesthesia Plan  ASA: III  Anesthesia Plan: Epidural   Post-op Pain Management:    Induction:   PONV Risk Score and Plan:   Airway Management Planned:   Additional Equipment:   Intra-op Plan:   Post-operative Plan:   Informed Consent: I have reviewed the patients History and Physical, chart, labs and discussed the procedure including the risks, benefits and alternatives for the proposed anesthesia with the patient or authorized representative who has indicated his/her understanding and acceptance.       Plan Discussed with:   Anesthesia Plan Comments:         Anesthesia Quick Evaluation  

## 2019-07-15 NOTE — Progress Notes (Signed)
L&D Note  07/15/2019 - 4:46 PM  28 y.o. G1P0000 956w5d. Pregnancy complicated by GBS pos  Patient Active Problem List   Diagnosis Date Noted  . Chorioamnionitis 07/15/2019  . Post term pregnancy over 40 weeks 07/13/2019    Katie Whitney is admitted for post dates IOL   Subjective:  Feeling more pain and pressure and feels UCs now  Objective:    Current Vital Signs 24h Vital Sign Ranges  T 99.5 F (37.5 C) Temp  Avg: 99 F (37.2 C)  Min: 98.1 F (36.7 C)  Max: 100.8 F (38.2 C)  BP 123/67 BP  Min: 108/63  Max: 134/77  HR 94 Pulse  Avg: 104  Min: 84  Max: 120  RR 18 Resp  Avg: 18.5  Min: 16  Max: 22  SaO2 100 %   SpO2  Avg: 97.7 %  Min: 94 %  Max: 100 %       24 Hour I/O Current Shift I/O  Time Ins Outs No intake/output data recorded. 08/27 0701 - 08/27 1900 In: -  Out: 100 [Urine:100]   FHR: 160-165 baseline, no accels, +early decels, min mod variability,  Toco: q4-5239m Gen: NAD SVE: complete BPD to +2 and +3 with pushing  Labs:  Recent Labs  Lab 07/13/19 1554  WBC 9.2  HGB 10.6*  HCT 32.5*  PLT 251    Medications Current Facility-Administered Medications  Medication Dose Route Frequency Provider Last Rate Last Dose  . acetaminophen (TYLENOL) tablet 650 mg  650 mg Oral Q4H PRN Sparacino, Hailey L, DO   650 mg at 07/15/19 1555  . ampicillin (OMNIPEN) 2 g in sodium chloride 0.9 % 100 mL IVPB  2 g Intravenous Q6H Venora MaplesEckstat, Matthew M, MD 300 mL/hr at 07/15/19 1427 2 g at 07/15/19 1427  . diphenhydrAMINE (BENADRYL) injection 12.5 mg  12.5 mg Intravenous Q15 min PRN Cecile Hearingurk, Stephen Edward, MD      . ePHEDrine injection 10 mg  10 mg Intravenous PRN Cecile Hearingurk, Stephen Edward, MD      . ePHEDrine injection 10 mg  10 mg Intravenous PRN Cecile Hearingurk, Stephen Edward, MD      . fentaNYL (SUBLIMAZE) injection 100 mcg  100 mcg Intravenous Q1H PRN Cheral MarkerBooker, Kimberly R, CNM   100 mcg at 07/15/19 0249  . fentaNYL 2 mcg/mL w/ bupivacaine 0.125% in NS 250 mL epidural infusion ([facility]ANES)  12  mL/hr Epidural Continuous PRN Cecile Hearingurk, Stephen Edward, MD      . lactated ringers infusion 500 mL  500 mL Intravenous Once Cecile Hearingurk, Stephen Edward, MD      . lactated ringers infusion 500-1,000 mL  500-1,000 mL Intravenous PRN Sparacino, Hailey L, DO 999 mL/hr at 07/15/19 1259 1,000 mL at 07/15/19 1259  . lactated ringers infusion   Intravenous Continuous Sparacino, Hailey L, DO 125 mL/hr at 07/15/19 1250    . lidocaine (PF) (XYLOCAINE) 1 % injection 30 mL  30 mL Subcutaneous PRN Sparacino, Hailey L, DO      . misoprostol (CYTOTEC) tablet 50 mcg  50 mcg Buccal Q4H PRN Sparacino, Hailey L, DO   50 mcg at 07/14/19 0610  . ondansetron (ZOFRAN) injection 4 mg  4 mg Intravenous Q6H PRN Sparacino, Hailey L, DO      . oxyCODONE-acetaminophen (PERCOCET/ROXICET) 5-325 MG per tablet 1 tablet  1 tablet Oral Q4H PRN Sparacino, Hailey L, DO      . oxyCODONE-acetaminophen (PERCOCET/ROXICET) 5-325 MG per tablet 2 tablet  2 tablet Oral Q4H PRN Sparacino, Hailey L,  DO      . oxytocin (PITOCIN) IV BOLUS FROM BAG  500 mL Intravenous Once Sparacino, Hailey L, DO      . oxytocin (PITOCIN) IV infusion 40 units in NS 1000 mL - Premix  2.5 Units/hr Intravenous Continuous Sparacino, Hailey L, DO      . oxytocin (PITOCIN) IV infusion 40 units in NS 1000 mL - Premix  1-40 milli-units/min Intravenous Titrated Clarnce Flock, MD 42 mL/hr at 07/15/19 1544 28 milli-units/min at 07/15/19 1544  . PHENYLephrine 40 mcg/ml in normal saline Adult IV Push Syringe  80 mcg Intravenous PRN Catalina Gravel, MD      . PHENYLephrine 40 mcg/ml in normal saline Adult IV Push Syringe  80 mcg Intravenous PRN Catalina Gravel, MD      . sodium citrate-citric acid (ORACIT) solution 30 mL  30 mL Oral Q2H PRN Sparacino, Hailey L, DO      . terbutaline (BRETHINE) injection 0.25 mg  0.25 mg Subcutaneous Once PRN Sparacino, Hailey L, DO      . terbutaline (BRETHINE) injection 0.25 mg  0.25 mg Subcutaneous Once PRN Sparacino, Hailey L, DO        Facility-Administered Medications Ordered in Other Encounters  Medication Dose Route Frequency Provider Last Rate Last Dose  . fentaNYL (SUBLIMAZE) 500 mcg, bupivacaine (SENSORCAINE-MPF) 41.67 mL in sodium chloride (PF) 0.9 % 250 mL epidural    Continuous PRN Lidia Collum, MD 12 mL/hr at 07/15/19 0415 12 mL/hr at 07/15/19 0415  . lidocaine (PF) (XYLOCAINE) 1 % injection    Anesthesia Intra-op Lidia Collum, MD   10 mL at 07/15/19 0411    Assessment & Plan:  Pt stable *IUP: fetal status reassuring and improving status post abx, ivf bolus *Labor: continue with pushing and reassess in 30-91m if continues to bring down lower, can offer operative vag delivery.  IUPD in place *Chorio: continue amp/gent *GBS pos: can d/c pcn *Foley: follow up post delivery. Unable to I/O cath due to fetal head being so low.  -s/p traumatic accidental removal due to it being taped too low *Analgesia: d/w her try and hold off on using epidural button  Durene Romans MD Attending Center for [facility])

## 2019-07-15 NOTE — Progress Notes (Signed)
LABOR PROGRESS NOTE  Katie Whitney is a 28 y.o. G1P0000 at [redacted]w[redacted]d  admitted for PD IOL. Pregnancy complicated by n/a.   Subjective: Feeling more pressure with contractions  Objective: BP 127/73   Pulse (!) 119   Temp (!) 100.8 F (38.2 C) (Axillary)   Resp 18   Ht 5\' 1"  (1.549 m)   Wt 96 kg   LMP 10/29/2018 (Exact Date)   SpO2 100%   BMI 40.00 kg/m  or  Vitals:   07/15/19 1228 07/15/19 1231 07/15/19 1301 07/15/19 1331  BP:  127/78 129/70 127/73  Pulse:  (!) 110 (!) 102 (!) 119  Resp:  20 18 18   Temp: (!) 100.8 F (38.2 C)     TempSrc: Axillary     SpO2:      Weight:      Height:         Dilation: 10 Dilation Complete Date: 07/15/19 Dilation Complete Time: 1307 Effacement (%): 100 Cervical Position: Anterior Station: Plus 2, Plus 3 Presentation: Vertex Exam by:: Dr. Dione Plover FHT: baseline rate 160, minimal varibility, +acel, early and variable decel Toco: q3-4 min ctx  Labs: Lab Results  Component Value Date   WBC 9.2 07/13/2019   HGB 10.6 (L) 07/13/2019   HCT 32.5 (L) 07/13/2019   MCV 91.8 07/13/2019   PLT 251 07/13/2019    Patient Active Problem List   Diagnosis Date Noted  . Post term pregnancy over 40 weeks 07/13/2019    Assessment / Plan: 28 y.o. G1P0000 at [redacted]w[redacted]d here for PD IOL. Pregnancy complicated by n/a.  Labor: s/p Miso x4, FB, and on Pit since 8/26 @ 1300. Now fully and pushing and making descent however with borderline temp and elevated axillary temp, suspect developing chorioamnionitis, will start amp/gent and give tylenol.  Fetal Wellbeing:  Cat II, but with accels, monitor for labor progress closely Pain Control:  epidural Anticipated MOD:  SVD  Augustin Coupe, MD/[facility] OB Fellow  07/15/2019, 1:49 PM

## 2019-07-15 NOTE — Progress Notes (Signed)
Pharmacy Antibiotic Note  Katie Whitney is a 28 y.o. female admitted on 07/13/2019 at [redacted]w[redacted]d gestation & with low grade temp, possible chorioamnionitis. Pt is currently in labor.  Pharmacy has been consulted for Gentamicin dosing.  Plan: Gentamicin 340mg  IV (5mg /kg) x 1 F/u if needed beyond 24 hours.  Height: 5\' 1"  (154.9 cm) Weight: 211 lb 11.2 oz (96 kg) IBW/kg (Calculated) : 47.8  Temp (24hrs), Avg:98.9 F (37.2 C), Min:98 F (36.7 C), Max:100.8 F (38.2 C)  Recent Labs  Lab 07/13/19 1554  WBC 9.2    CrCl cannot be calculated (No successful lab value found.).    No Known Allergies  Antimicrobials this admission: 07/15/19 Ampicillin 2g IV Q 6 hr >>    Thank you for allowing pharmacy to be a part of this patient's care.  Hovey-Rankin, Tamesha Ellerbrock 07/15/2019 2:30 PM

## 2019-07-15 NOTE — Progress Notes (Signed)
Patient ID: Katie Whitney, female   DOB: [birth year], 28 y.o.   MRN: [id number] Late entry epic downtime  AROM clear fluid Vitals:   07/15/19 0455 07/15/19 0459 07/15/19 0500 07/15/19 0530  BP:   121/75 120/74  Pulse:   98 (!) 103  Resp:   18 18  Temp:  98.1 F (36.7 C)    TempSrc:  Oral    SpO2: 100%     Weight:      Height:       Cervix 5/70/-2/vtx  FHR stable and reassuring UCs irregular

## 2019-07-15 NOTE — Progress Notes (Addendum)
L&D Note  07/15/2019 - 3:23 PM  28 y.o. G1P0000 [redacted]w[redacted]d. Pregnancy complicated by GBS pos  Patient Active Problem List   Diagnosis Date Noted  . Post term pregnancy over 40 weeks 07/13/2019    Ms. Katie Whitney is admitted for post dates IOL   Subjective:  Doesn't feel UCs. +pelvic discomfort.  Objective:   Vitals:   07/15/19 1351 07/15/19 1401 07/15/19 1431 07/15/19 1501  BP:  122/76 124/72   Pulse:  (!) 111 (!) 101   Resp:  20 18 18   Temp: 98.6 F (37 C)     TempSrc: Oral     SpO2:      Weight:      Height:        Current Vital Signs 24h Vital Sign Ranges  T 98.6 F (37 C) Temp  Avg: 99 F (37.2 C)  Min: 98.1 F (36.7 C)  Max: 100.8 F (38.2 C)  BP 124/72 BP  Min: 108/63  Max: 133/78  HR (!) 101 Pulse  Avg: 103.5  Min: 84  Max: 120  RR 18 Resp  Avg: 18.5  Min: 16  Max: 22  SaO2 100 %   SpO2  Avg: 97.7 %  Min: 94 %  Max: 100 %       24 Hour I/O Current Shift I/O  Time Ins Outs No intake/output data recorded. No intake/output data recorded.   FHR: 155 baseline, no accels, occasional variables, mod variability,  Toco: difficult to trace. Pt had one q5-73m when I was in room Gen: NAD SVE: complete/BPD +1 to +2/LOA/decent effort with pushing. +fetal scalp stim.   Labs:  Recent Labs  Lab 07/13/19 1554  WBC 9.2  HGB 10.6*  HCT 32.5*  PLT 251    Medications Current Facility-Administered Medications  Medication Dose Route Frequency Provider Last Rate Last Dose  . acetaminophen (TYLENOL) tablet 650 mg  650 mg Oral Q4H PRN Sparacino, Hailey L, DO      . ampicillin (OMNIPEN) 2 g in sodium chloride 0.9 % 100 mL IVPB  2 g Intravenous Q6H Clarnce Flock, MD 300 mL/hr at 07/15/19 1427 2 g at 07/15/19 1427  . diphenhydrAMINE (BENADRYL) injection 12.5 mg  12.5 mg Intravenous Q15 min PRN Catalina Gravel, MD      . ePHEDrine injection 10 mg  10 mg Intravenous PRN Catalina Gravel, MD      . ePHEDrine injection 10 mg  10 mg Intravenous PRN Catalina Gravel, MD      . fentaNYL (SUBLIMAZE) injection 100 mcg  100 mcg Intravenous Q1H PRN [name], CNM   100 mcg at 07/15/19 0249  . fentaNYL 2 mcg/mL w/ bupivacaine 0.125% in NS 250 mL epidural infusion ([facility]-ANES)  12 mL/hr Epidural Continuous PRN Catalina Gravel, MD      . gentamicin (GARAMYCIN) 340 mg in dextrose 5 % 100 mL IVPB  5 mg/kg (Adjusted) Intravenous Once Aletha Halim, MD 108.5 mL/hr at 07/15/19 1458 340 mg at 07/15/19 1458  . lactated ringers infusion 500 mL  500 mL Intravenous Once Catalina Gravel, MD      . lactated ringers infusion 500-1,000 mL  500-1,000 mL Intravenous PRN Sparacino, Hailey L, DO 999 mL/hr at 07/15/19 1259 1,000 mL at 07/15/19 1259  . lactated ringers infusion   Intravenous Continuous Sparacino, Hailey L, DO 125 mL/hr at 07/15/19 1250    . lidocaine (PF) (XYLOCAINE) 1 % injection 30 mL  30 mL Subcutaneous PRN Sparacino, Hailey  L, DO      . misoprostol (CYTOTEC) tablet 50 mcg  50 mcg Buccal Q4H PRN Sparacino, Hailey L, DO   50 mcg at 07/14/19 0610  . ondansetron (ZOFRAN) injection 4 mg  4 mg Intravenous Q6H PRN Sparacino, Hailey L, DO      . oxyCODONE-acetaminophen (PERCOCET/ROXICET) 5-325 MG per tablet 1 tablet  1 tablet Oral Q4H PRN Sparacino, Hailey L, DO      . oxyCODONE-acetaminophen (PERCOCET/ROXICET) 5-325 MG per tablet 2 tablet  2 tablet Oral Q4H PRN Sparacino, Hailey L, DO      . oxytocin (PITOCIN) IV BOLUS FROM BAG  500 mL Intravenous Once Sparacino, Hailey L, DO      . oxytocin (PITOCIN) IV infusion 40 units in NS 1000 mL - Premix  2.5 Units/hr Intravenous Continuous Sparacino, Hailey L, DO      . oxytocin (PITOCIN) IV infusion 40 units in NS 1000 mL - Premix  1-40 milli-units/min Intravenous Titrated Venora MaplesEckstat, Matthew M, MD 39 mL/hr at 07/15/19 1343 26 milli-units/min at 07/15/19 1343  . penicillin G 3 million units in sodium chloride 0.9% 100 mL IVPB  3 Million Units Intravenous Q4H Sparacino, Hailey L, DO 200 mL/hr at 07/15/19 1125 3  Million Units at 07/15/19 1125  . PHENYLephrine 40 mcg/ml in normal saline Adult IV Push Syringe  80 mcg Intravenous PRN Cecile Hearingurk, Stephen Edward, MD      . PHENYLephrine 40 mcg/ml in normal saline Adult IV Push Syringe  80 mcg Intravenous PRN Cecile Hearingurk, Stephen Edward, MD      . sodium citrate-citric acid (ORACIT) solution 30 mL  30 mL Oral Q2H PRN Sparacino, Hailey L, DO      . terbutaline (BRETHINE) injection 0.25 mg  0.25 mg Subcutaneous Once PRN Sparacino, Hailey L, DO      . terbutaline (BRETHINE) injection 0.25 mg  0.25 mg Subcutaneous Once PRN Sparacino, Hailey L, DO       Facility-Administered Medications Ordered in Other Encounters  Medication Dose Route Frequency Provider Last Rate Last Dose  . fentaNYL (SUBLIMAZE) 500 mcg, bupivacaine (SENSORCAINE-MPF) 41.67 mL in sodium chloride (PF) 0.9 % 250 mL epidural    Continuous PRN Lucretia KernWitman, Carolyn E, MD 12 mL/hr at 07/15/19 0415 12 mL/hr at 07/15/19 0415  . lidocaine (PF) (XYLOCAINE) 1 % injection    Anesthesia Intra-op Lucretia KernWitman, Carolyn E, MD   10 mL at 07/15/19 0411    Assessment & Plan:  Pt stable *IUP: fetal status reassuring and improving status post abx, ivf bolus *Labor: pt pushes okay. IUPC placed to better delineate when to push. efw 3500gm and pelvis feels adequate. Will reassess in 1 hour. Pt has been pushing for about 1-1.5 hours. Continue pitocin *Chorio: continue amp/gent *GBS pos: can d/c pcn *Foley: came out when patient was pushing b/c was taped too low. Tried to place foley but unable to enter bladder but likely due to low fetal status. Ultrasound done and probably around 25-2150mL urine in bladder.. No obvious e/o urethral damage. Would recommend consider leaving foley in until just prior to discharge after delivery and checking ultrasound prior to inflating bulb.  *Analgesia: d/w her try and hold off on using epidural button  Cornelia Copaharlie Kyira Volkert, Jr. MD Attending Center for [facility](Faculty Practice)

## 2019-07-16 NOTE — Anesthesia Postprocedure Evaluation (Signed)
Anesthesia Post Note  Patient: Quarry manager  Procedure(s) Performed: AN AD HOC LABOR EPIDURAL     Patient location during evaluation: Mother Baby Anesthesia Type: Epidural Level of consciousness: awake and alert and oriented Pain management: satisfactory to patient Vital Signs Assessment: post-procedure vital signs reviewed and stable Respiratory status: respiratory function stable Cardiovascular status: stable Postop Assessment: no headache, no backache, epidural receding, patient able to bend at knees, no signs of nausea or vomiting and adequate PO intake Anesthetic complications: no    Last Vitals:  Vitals:   07/16/19 0213 07/16/19 0602  BP: 104/66 112/84  Pulse: (!) 111 (!) 102  Resp: 16 18  Temp: 37.4 C 36.8 C  SpO2:      Last Pain:  Vitals:   07/16/19 0610  TempSrc:   PainSc: 6    Pain Goal: Patients Stated Pain Goal: 7 (07/13/19 1722)                 Katherina Mires

## 2019-07-16 NOTE — Progress Notes (Signed)
Spoke with Dr. Pilar Plate regarding status of voiding trial. Pt has voided x2 for a total of 300 mL. Per Dr. Pilar Plate patient will need a post void residual bladder scan after next void. Pt informed to call out after next void. Pt verbalizes understanding. Call back for post void residual scan of greater than 29mL.

## 2019-07-16 NOTE — Progress Notes (Addendum)
Post Partum Day 1  Subjective: Pt doing well this morning. Has tried breastfeeding, however is not producing much breast milk so formula feeding for now. Lochia is appropriate, changed yellow pads X 3-4 times. Does not know whether she has passed clots. Has not yet mobilized much but eating well. Denies fevers, chest pain, SOB, cough, dizziness, palpitations or abdo pain.    Objective: Blood pressure 112/84, pulse (!) 102, temperature 98.3 F (36.8 C), temperature source Oral, resp. rate 18, height 5\' 1"  (1.549 m), weight 96 kg, last menstrual period 10/29/2018, SpO2 99 %, unknown if currently breastfeeding.  Physical Exam:  General: alert, cooperative and appears 28 stated age, no distress Cardiac: RRR, no rubs or gallops, no cap refill Pulm: lungs clear on exam, no wheeze or crackles, no acute respiratory distress GI: Abdo soft non tender, non distended, bowel sounds present, foley catheter in situ Lochia: appropriate Uterine Fundus: firm Incision: n/a DVT Evaluation: No cords or calf tenderness. No significant calf/ankle edema.  Recent Labs    07/13/19 1554  HGB 10.6*  HCT 32.5*    Assessment/Plan: Katie Whitney is a 28 y.o. G1P1001 s/p forceps assisted vaginal delivery at [redacted]w[redacted]d. She was admitted for PD IOL. #Voiding trial today #Encourage mobilization #Birth control: declined, does not want hormonal methods #Lactation specialist  Plan for discharge tomorrow     LOS: 3 days   Lattie Haw, MD PGY-1, [facility]  07/16/2019, 8:52 AM    Attestation:  I confirm that I have verified the information documented in the resident's note and that I have also personally reperformed the physical exam and all medical decision making activities.   Patient was seen and examined by me also Agree with note Vitals stable Labs stable Fundus firm, lochia within normal limits Perineum healing Ext WNL Continue care Anticipate discharge tomorrow  Seabron Spates,  CNM

## 2019-07-16 NOTE — Lactation Note (Signed)
This note was copied from a baby's chart. Lactation Consultation Note  Patient Name: Katie Whitney [id number] Date: 07/16/2019 Reason for consult: Initial assessment;Primapara;1st time breastfeeding;Term;Other (Comment)(speaks [place] interpeter - [place] [phone])  Baby is 3 hours old  As LC entered the room baby awake . Dad mentioned the baby was fed 15 ml of formula at 1:55 pm and had a black stool.  LC reviewed supply and demand / importance of working on the breast feeding and latching.  LC offered to assist to latch and after several attempts and a 5 ml appetizer baby latched and fed for 20 mins with increased swallows.  Mom mentioned she felt like she didn't have milk yet.  [place] reassured her the more latching the better chance her supply will be adequate and plenty. LC also explained if the baby latches and feeds both sides and is satisfied, hold off on the formula. If not top the baby off with 15 -20 ml since the baby is use to more volume.  Dad was asking for a pump for his wife . LC instructed both mom and dad on the use of the hand pump.  Dad mentioned they are active with [facility] and showed the [place] the ID.  Mom mentioned she had several breast changes with pregnancy.  Mom and dad aware of the [facility] resources after D/C.    Maternal Data Has patient been taught Hand Expression?: Yes Does the patient have breastfeeding experience prior to this delivery?: No  Feeding Feeding Type: Breast Fed  LATCH Score Latch: Grasps breast easily, tongue down, lips flanged, rhythmical sucking.  Audible Swallowing: A few with stimulation(increased to 2)  Type of Nipple: Everted at rest and after stimulation  Comfort (Breast/Nipple): Soft / non-tender  Hold (Positioning): Assistance needed to correctly position infant at breast and maintain latch.  LATCH Score: 8  Interventions Interventions: Breast feeding basics reviewed;Assisted with latch;Skin to skin;Breast massage;Hand  express;Adjust position;Breast compression;Position options;Support pillows;Hand pump  Lactation Tools Discussed/Used Tools: Pump Breast pump type: Manual WIC Program: Yes(per dad and showed LC the I.D. for [facility]) Pump Review: Setup, frequency, and cleaning Initiated by:: MAI Date initiated:: 07/16/19   Consult Status Consult Status: Follow-up Date: 07/17/19 Follow-up type: In-patient    [place] 07/16/2019, 3:00 PM

## 2019-07-17 ENCOUNTER — Inpatient Hospital Stay (HOSPITAL_COMMUNITY): Payer: Medicaid Other

## 2019-07-17 MED ORDER — BENZOCAINE-MENTHOL 20-0.5 % EX AERO: 1.0000 "application " | INHALATION_SPRAY | CUTANEOUS | 0 refills | Status: DC | PRN

## 2019-07-17 MED ORDER — MAGNESIUM HYDROXIDE 400 MG/5ML PO SUSP: 30.0000 mL | ORAL | 0 refills | Status: DC | PRN

## 2019-07-17 MED ORDER — ACETAMINOPHEN 325 MG PO TABS: 650.0000 mg | ORAL_TABLET | ORAL | 3 refills | Status: DC | PRN

## 2019-07-17 MED ORDER — SENNOSIDES-DOCUSATE SODIUM 8.6-50 MG PO TABS: 2.0000 | ORAL_TABLET | ORAL | 3 refills | Status: DC

## 2019-07-17 MED ORDER — DIBUCAINE (PERIANAL) 1 % EX OINT: 1.0000 "application " | TOPICAL_OINTMENT | CUTANEOUS | 0 refills | Status: DC | PRN

## 2019-07-17 MED ORDER — POLYETHYLENE GLYCOL 3350 17 G PO PACK: 17.0000 g | PACK | Freq: Every day | ORAL | 0 refills | Status: DC

## 2019-07-17 MED ORDER — WITCH HAZEL-GLYCERIN EX PADS: 1.0000 "application " | MEDICATED_PAD | CUTANEOUS | 12 refills | Status: DC | PRN

## 2019-07-17 MED ORDER — SIMETHICONE 80 MG PO CHEW: 80.0000 mg | CHEWABLE_TABLET | ORAL | 0 refills | Status: DC | PRN

## 2019-07-17 NOTE — Discharge Instructions (Signed)

## 2019-07-17 NOTE — Lactation Note (Signed)
This note was copied from a baby's chart. Lactation Consultation Note  Patient Name: Katie Whitney [id number] Date: 07/17/2019 Reason for consult: Follow-up assessment;Primapara;1st time breastfeeding;Infant weight loss;Term  Used [employer] for [employer], interpreter "Fredirick Maudlin" # [phone]  15 hours old FT female who is being mostly formula fed by her mother, she's a P1. Mom and baby are going home today, she's at 1% weight loss. Per RN mom hasn't been putting baby to the breast since Lactation saw her yesterday. Mom also told LC that she tried pumping but there's "no milk" coming out. Encouraged mom to pump whenever she's offering baby a bottle with formula. Reviewed Lactogenesis II, discharge instructions, engorgement prevention and treatment, treatment/prevention for sore nipples.  Dad asked for a formula discharge bag prior discharge, explained to parents that this is a baby friendly hospital and we don't do discharge bags anymore, but mom will be checking in with [facility] to get more formula. Parents reported all questions and concerns were answered, they're both aware of [facility] OP services and will call PRN.  Maternal Data    Feeding    LATCH Score                   Interventions Interventions: Breast feeding basics reviewed  Lactation Tools Discussed/Used     Consult Status Consult Status: Complete Date: 07/17/19 Follow-up type: In-patient    [place] 07/17/2019, 10:18 AM

## 2019-07-23 ENCOUNTER — Encounter: Payer: Self-pay | Admitting: Obstetrics and Gynecology

## 2019-07-23 ENCOUNTER — Other Ambulatory Visit: Payer: Self-pay

## 2019-07-23 ENCOUNTER — Ambulatory Visit (INDEPENDENT_AMBULATORY_CARE_PROVIDER_SITE_OTHER): Payer: Medicaid Other | Admitting: Obstetrics and Gynecology

## 2019-07-23 VITALS — BP 127/83 | HR 73 | Wt 197.5 lb

## 2019-07-23 DIAGNOSIS — Z758 Other problems related to medical facilities and other health care: Secondary | ICD-10-CM

## 2019-07-23 DIAGNOSIS — S3730XD Unspecified injury of urethra, subsequent encounter: Secondary | ICD-10-CM

## 2019-07-23 DIAGNOSIS — L308 Other specified dermatitis: Secondary | ICD-10-CM

## 2019-07-23 DIAGNOSIS — Z789 Other specified health status: Secondary | ICD-10-CM

## 2019-07-23 DIAGNOSIS — Z9889 Other specified postprocedural states: Secondary | ICD-10-CM

## 2019-07-23 DIAGNOSIS — R102 Pelvic and perineal pain: Secondary | ICD-10-CM

## 2019-07-23 DIAGNOSIS — R600 Localized edema: Secondary | ICD-10-CM

## 2019-07-23 MED ORDER — HYDROCHLOROTHIAZIDE 25 MG PO TABS: 25.0000 mg | ORAL_TABLET | Freq: Every day | ORAL | 0 refills | Status: DC

## 2019-07-23 MED ORDER — HYDROCORTISONE 1 % EX OINT: 1.0000 "application " | TOPICAL_OINTMENT | Freq: Two times a day (BID) | CUTANEOUS | 1 refills | Status: DC | PRN

## 2019-07-23 NOTE — Progress Notes (Signed)
[facility] Interpreter # [phone]

## 2019-07-24 ENCOUNTER — Encounter: Payer: Self-pay | Admitting: Obstetrics and Gynecology

## 2019-07-24 DIAGNOSIS — S3730XA Unspecified injury of urethra, initial encounter: Secondary | ICD-10-CM

## 2019-07-24 DIAGNOSIS — Z789 Other specified health status: Secondary | ICD-10-CM | POA: Insufficient documentation

## 2019-07-24 DIAGNOSIS — L309 Dermatitis, unspecified: Secondary | ICD-10-CM | POA: Insufficient documentation

## 2019-07-24 DIAGNOSIS — R600 Localized edema: Secondary | ICD-10-CM | POA: Insufficient documentation

## 2019-07-24 HISTORY — DX: Unspecified injury of urethra, initial encounter: S37.30XA

## 2019-07-24 LAB — BASIC METABOLIC PANEL
BUN/Creatinine Ratio: 15 (ref 9–23)
BUN: 10 mg/dL (ref 6–20)
CO2: 22 mmol/L (ref 20–29)
Calcium: 9.1 mg/dL (ref 8.7–10.2)
Chloride: 106 mmol/L (ref 96–106)
Creatinine, Ser: 0.67 mg/dL (ref 0.57–1.00)
GFR calc Af Amer: 138 mL/min/{1.73_m2} (ref >59)
GFR calc non Af Amer: 120 mL/min/{1.73_m2} (ref >59)
Glucose: 99 mg/dL (ref 65–99)
Potassium: 3.8 mmol/L (ref 3.5–5.2)
Sodium: 141 mmol/L (ref 134–144)

## 2019-07-24 LAB — MAGNESIUM: Magnesium: 1.8 mg/dL (ref 1.6–2.3)

## 2019-07-24 NOTE — Progress Notes (Addendum)
[facility] 07/23/2019  CC: scheduled post delivery follow up  Subjective:   S/p 8/27 FAVD/3a laceration. Delivery c/b by foley accidentally coming out without deflation during placement of dorsal lithotomy position. Patient was discharged to home on ppd#2.  No issues with BMs or voiding. She is having a moderate amount of pelvic discomfort. Minimal lochia. No fevers, chills.    Objective:    BP 127/83   Pulse 73   Wt 197 lb 8 oz (89.6 kg)   LMP 10/29/2018 (Exact Date)   BMI 37.32 kg/m  NAD EGBUS normal, perineum, c/d/i and no e/o infection and repair looks great. nttp in this area Pt is moderately uncomfortable to palpation on the anterior vagina in the area of the urethera Scant amount of old blood in the vault.  Skin: rash c/w eczema on shoulders, arms 2 to 3+ LE edema symmetric Assessment:   Pt stable   Plan:  3rd degree repair healing well Pt not having voiding issues but just uncomfortable in that area. No e/o urine leakage If still uncomfortable at regular postpartum check, recommend urology referral  Not breast feeding. HCTZ x 2 weeks for LE edema. Check bmp, mg Hydrocortisone cream for eczema Interpreter used  Aletha Halim, Brooke Bonito MD Attending Center for [employer] [facility])

## 2019-08-13 ENCOUNTER — Telehealth: Payer: Self-pay | Admitting: Obstetrics and Gynecology

## 2019-08-13 NOTE — Telephone Encounter (Signed)
Spoke with patient w/ [place] interpreter ID# [phone] about her appointment on 9/28 @ 10:15. Patient instructed to wear a face mask for the entire appointment and no visitors are allowed. Patient screened for covid symptoms and denied having any.

## 2019-08-16 ENCOUNTER — Other Ambulatory Visit: Payer: Self-pay

## 2019-08-16 ENCOUNTER — Encounter: Payer: Self-pay | Admitting: Obstetrics and Gynecology

## 2019-08-16 ENCOUNTER — Telehealth: Payer: Self-pay | Admitting: Obstetrics and Gynecology

## 2019-08-16 ENCOUNTER — Ambulatory Visit (INDEPENDENT_AMBULATORY_CARE_PROVIDER_SITE_OTHER): Payer: Medicaid Other | Admitting: Obstetrics and Gynecology

## 2019-08-16 ENCOUNTER — Encounter (HOSPITAL_COMMUNITY): Payer: Self-pay | Admitting: Obstetrics and Gynecology

## 2019-08-16 ENCOUNTER — Telehealth (HOSPITAL_COMMUNITY): Payer: Self-pay | Admitting: *Deleted

## 2019-08-16 ENCOUNTER — Ambulatory Visit (HOSPITAL_COMMUNITY)
Admission: RE | Admit: 2019-08-16 | Discharge: 2019-08-16 | Disposition: A | Discharge status: Home / Self Care | Payer: Medicaid Other | Source: Ambulatory Visit | Attending: Obstetrics and Gynecology | Admitting: Obstetrics and Gynecology

## 2019-08-16 VITALS — BP 126/83 | HR 99 | Wt 195.0 lb

## 2019-08-16 DIAGNOSIS — R600 Localized edema: Secondary | ICD-10-CM

## 2019-08-16 DIAGNOSIS — B373 Candidiasis of vulva and vagina: Secondary | ICD-10-CM

## 2019-08-16 DIAGNOSIS — R3989 Other symptoms and signs involving the genitourinary system: Secondary | ICD-10-CM | POA: Insufficient documentation

## 2019-08-16 DIAGNOSIS — B3731 Acute candidiasis of vulva and vagina: Secondary | ICD-10-CM

## 2019-08-16 DIAGNOSIS — B9689 Other specified bacterial agents as the cause of diseases classified elsewhere: Secondary | ICD-10-CM

## 2019-08-16 MED ORDER — METRONIDAZOLE 500 MG PO TABS: 500.0000 mg | ORAL_TABLET | Freq: Two times a day (BID) | ORAL | 0 refills | Status: DC

## 2019-08-16 MED ORDER — FLUCONAZOLE 150 MG PO TABS: 150.0000 mg | ORAL_TABLET | Freq: Once | ORAL | 0 refills | Status: AC

## 2019-08-16 MED ORDER — SENNA 8.6 MG PO TABS: 1.0000 | ORAL_TABLET | Freq: Every evening | ORAL | 0 refills | Status: DC | PRN

## 2019-08-16 MED ORDER — POLYETHYLENE GLYCOL 3350 17 G PO PACK: 17.0000 g | PACK | Freq: Every day | ORAL | 3 refills | Status: DC

## 2019-08-16 NOTE — Progress Notes (Signed)
Obstetrics and Gynecology Postpartum Visit  Appointment Date: 08/16/2019  OBGYN Clinic: Center for [facility]  Primary Care Provider: Patient, No Pcp Per  Chief Complaint:  Chief Complaint  Patient presents with  . Postpartum Care    History of Present Illness: Katie Whitney is a 28 y.o. Asian G1P1001, seen for the above chief complaint.    She is s/p low forceps assisted vag delivery on 8/27 with 3a perineal laceration; she was discharged to home on PPD#2  Her delivery was complicated by the foley bulb accidentally coming out when she was initially put in dorsal lithotomy due to it being taped too low on her leg.   Vaginal bleeding or discharge: rare bleeding Breast or formula feeding: formula Intercourse: No  Contraception after delivery: abstinence PP depression s/s: No  Any bowel or bladder issues: constipation Pap smear: no abnormalities (date: 2020)  She was seen on 9/4 for early postpartum follow up due to the 3a tear. The tear was healing well and aside from moderately ttp at the urethera she was doing well. She was put on hctz for two weeks for the b/l LE edema  She states that starting last night she has RLE pain and it looks bigger than the other one. No prior h/o of this and no chest pain, sob.   Has qday BM but sometimes has to strain. Uses miralax qoday  Review of Systems: as noted in the History of Present Illness.  Medications Aqsa Theiler had no medications administered during this visit. Current Outpatient Medications  Medication Sig Dispense Refill  . polyethylene glycol (MIRALAX / GLYCOLAX) 17 g packet Take 17 g by mouth daily. Increase to bid to tid prn 60 each 3  . Prenatal Vit-Fe Fumarate-FA (PRENATAL VITAMIN PO) Take by mouth.    . senna (SENOKOT) 8.6 MG TABS tablet Take 1 tablet (8.6 mg total) by mouth at bedtime as needed for mild constipation. 10 tablet 0   No current facility-administered medications for this visit.      Allergies Patient has no known allergies.  Physical Exam:  BP 126/83   Pulse 99   Wt 195 lb (88.5 kg)   LMP 10/29/2018 (Exact Date)   BMI 36.84 kg/m  Body mass index is 36.84 kg/m. General appearance: Well nourished, well developed female in no acute distress.  Extremities: right lower leg is slightly larger than left. She states it's painful but doesn't wince with palpation. No obvious cords, erythema.  Respiratory: Normal respiratory effort Abdomen: positive bowel sounds and no masses, hernias; diffusely non tender to palpation, non distended Neuro/Psych:  Normal mood and affect.  Skin:  Warm and dry.  Lymphatic:  No inguinal lymphadenopathy.   Pelvic exam: is not limited by body habitus EGBUS: mild to moderately ttp near the urethera. Much better than my exam at her last visit.  Vagina: no blood. Yellow discharge in vault  Cervix:  Nttp, normal Uterus:  Nonenlarged, nttp Adnexa:  normal adnexa and no mass, fullness, tenderness Rectovaginal: deferred. Perineum appears fully healed, some old loose vicryl suture easily removed with just wiping with gauze.   Laboratory: none  PP Depression Screening:  EPDS score zero  Assessment: pt improving  Plan:  *OB: perineum healing well. Would like OCPs and has no contraindications to them and d/w her and she would still like to try them. Defer on contraception until RLE assessment is done.  *Urology: pt states the discomfort is much better than last visit and she has no issues with  voiding. D/w her to either exp management or can refer to urology. Pt would like to do expectant management for a little while longer and will let us know if she'd like to do a referral. *RLE edema: will try and set up for outpatient u/s today. If cant, then will have to send her to MAU. Pt aware  Orders Placed This Encounter  Procedures  . VAS [place] LOWER EXTREMITY VENOUS (DVT)   Interpreter used  RTC PRN  Cornelia Copa MD Attending Center  for [facility])

## 2019-08-16 NOTE — Telephone Encounter (Signed)
Husband stated they were going to into the hospital and could not scheduled exam at this time.

## 2019-08-16 NOTE — Progress Notes (Signed)
Right lower extremity venous duplex completed. Refer to "CV Proc" under chart review to view preliminary results.  08/16/2019 2:46 PM Maudry Mayhew, MHA, RVT, RDCS, RDMS

## 2019-08-16 NOTE — Telephone Encounter (Signed)
Scheduled the patient for an doppler appointment today at 2pm prior to leaving the office. Consulted with CMA Elmyra Ricks as the registrar with cardiovascular would like to know who she can contact directly with the results. Dr. Ilda Basset provided a direct cell phone number to himself.

## 2019-08-17 ENCOUNTER — Other Ambulatory Visit: Payer: Self-pay | Admitting: Obstetrics and Gynecology

## 2019-08-17 MED ORDER — LO LOESTRIN FE 1 MG-10 MCG / 10 MCG PO TABS: 1.0000 | ORAL_TABLET | Freq: Every day | ORAL | 1 refills | Status: DC

## 2021-01-03 ENCOUNTER — Telehealth: Payer: Self-pay | Admitting: Family Medicine

## 2021-01-03 NOTE — Telephone Encounter (Signed)
Pt spouse calling for pt b/c he speaks [place] - pt has appt 01-12-21 but pt had an appt with PCP Mon. 01-01-21 and due to the increase blood flow that provider requested that pt call back here to get an earlier appt that pt is bleeding too much--per spouse, they would like a call back to see if pt can come in earlier than next Friday.

## 2021-01-04 NOTE — Telephone Encounter (Signed)
Called pt with [employer] interpreter ID [id number]. Pt reports changing a pad 6-7 times per day, bleeding twice a month, bleeding increases with activity. Explained that pt has earliest appt and there is no reason she should come into office before that time. Encouraged pt to continue Provera until appt. Reviewed that if pt begins saturating a pad every hour she should go to the ED for evaluation.

## 2021-01-12 ENCOUNTER — Other Ambulatory Visit: Payer: Self-pay

## 2021-01-12 ENCOUNTER — Ambulatory Visit (INDEPENDENT_AMBULATORY_CARE_PROVIDER_SITE_OTHER): Payer: Medicaid Other | Admitting: Obstetrics and Gynecology

## 2021-01-12 ENCOUNTER — Encounter: Payer: Self-pay | Admitting: Obstetrics and Gynecology

## 2021-01-12 VITALS — Ht 61.0 in | Wt 200.1 lb

## 2021-01-12 DIAGNOSIS — N946 Dysmenorrhea, unspecified: Secondary | ICD-10-CM

## 2021-01-12 DIAGNOSIS — Z789 Other specified health status: Secondary | ICD-10-CM

## 2021-01-12 DIAGNOSIS — N924 Excessive bleeding in the premenopausal period: Secondary | ICD-10-CM | POA: Diagnosis not present

## 2021-01-12 DIAGNOSIS — N939 Abnormal uterine and vaginal bleeding, unspecified: Secondary | ICD-10-CM

## 2021-01-12 HISTORY — DX: Dysmenorrhea, unspecified: N94.6

## 2021-01-12 HISTORY — DX: Abnormal uterine and vaginal bleeding, unspecified: N93.9

## 2021-01-12 MED ORDER — MEDROXYPROGESTERONE ACETATE 10 MG PO TABS: 10.0000 mg | ORAL_TABLET | Freq: Every day | ORAL | 1 refills | Status: DC

## 2021-01-12 NOTE — Progress Notes (Signed)
Obstetrics and Gynecology Visit Return Patient Evaluation  Appointment Date: 01/12/2021  Primary Care Provider: Patient, No Pcp Per  OBGYN Clinic: [facility] for [facility]  Chief Complaint: AUB  History of Present Illness:  Katie Whitney is a 30 y.o. G1P1 (LMP: unknown) with above CC.  Patient states that her period started about two months after her child was born in late August 2020 and that she only did formula feeding. She was given OCPs for [facility] but has never been on anything for [facility] since delivery.  Her periods were regular, monthly and somewhat heavy and painful and lasted about a week but starting in December 2021 her periods became irregular and started to occur more than once a month for a few days to a few weeks  She went to UC on 2/13 and they gave her qday provera 10mg  and she finished her last pill today with her last bleeding on 2/18; cbc was normal. Pap negative 2020  Review of Systems:  as noted in the History of Present Illness. Medications:  Saniah Petre had no medications administered during this visit. Current Outpatient Medications  Medication Sig Dispense Refill  . medroxyPROGESTERone (PROVERA) 10 MG tablet Take 1 tablet (10 mg total) by mouth daily. 30 tablet 1  . meloxicam (MOBIC) 15 MG tablet Take 15 mg by mouth daily.     No current facility-administered medications for this visit.    Allergies: has No Known Allergies.  Physical Exam:  Ht 5\' 1"  (1.549 m)   Wt 200 lb 1.6 oz (90.8 kg)   LMP 12/20/2020 (Exact Date)   BMI 37.81 kg/m  Body mass index is 37.81 kg/m. General appearance: Well nourished, well developed female in no acute distress.  Neuro/Psych:  Normal mood and affect.    Assessment: pt stable  Plan:  1. Abnormal uterine bleeding (AUB) I told her I recommend continuing on the provera for now while her work up is being done; refill sent in  Needs exam next visit - TSH - Hemoglobin A1c - PELVIC COMPLETE WITH  TRANSVAGINAL; Future - Beta hCG quant (ref lab)  2. Language barrier interpeter used   RTC: after u/s  02/17/2021 MD Attending Center for [place] [facility])

## 2021-01-13 LAB — HEMOGLOBIN A1C
Est. average glucose Bld gHb Est-mCnc: 114 mg/dL
Hgb A1c MFr Bld: 5.6 % (ref 4.8–5.6)

## 2021-01-13 LAB — TSH: TSH: 4.28 u[IU]/mL (ref 0.450–4.500)

## 2021-01-13 LAB — BETA HCG QUANT (REF LAB): hCG Quant: <1 m[IU]/mL

## 2021-01-19 ENCOUNTER — Ambulatory Visit: Payer: Medicaid Other

## 2021-01-25 ENCOUNTER — Other Ambulatory Visit: Payer: Self-pay

## 2021-01-25 ENCOUNTER — Ambulatory Visit
Admission: RE | Admit: 2021-01-25 | Discharge: 2021-01-25 | Disposition: A | Discharge status: Home / Self Care | Payer: Medicaid Other | Source: Ambulatory Visit | Attending: Obstetrics and Gynecology | Admitting: Obstetrics and Gynecology

## 2021-01-25 DIAGNOSIS — N939 Abnormal uterine and vaginal bleeding, unspecified: Secondary | ICD-10-CM | POA: Diagnosis not present

## 2021-02-12 ENCOUNTER — Ambulatory Visit (INDEPENDENT_AMBULATORY_CARE_PROVIDER_SITE_OTHER): Payer: Medicaid Other | Admitting: Obstetrics and Gynecology

## 2021-02-12 ENCOUNTER — Other Ambulatory Visit: Payer: Self-pay

## 2021-02-12 ENCOUNTER — Encounter: Payer: Self-pay | Admitting: Obstetrics and Gynecology

## 2021-02-12 VITALS — BP 125/84 | HR 96 | Wt 200.0 lb

## 2021-02-12 DIAGNOSIS — R935 Abnormal findings on diagnostic imaging of other abdominal regions, including retroperitoneum: Secondary | ICD-10-CM

## 2021-02-12 DIAGNOSIS — N939 Abnormal uterine and vaginal bleeding, unspecified: Secondary | ICD-10-CM

## 2021-02-12 DIAGNOSIS — Z789 Other specified health status: Secondary | ICD-10-CM | POA: Diagnosis not present

## 2021-02-12 DIAGNOSIS — Z758 Other problems related to medical facilities and other health care: Secondary | ICD-10-CM

## 2021-02-12 NOTE — Progress Notes (Signed)
Obstetrics and Gynecology Visit Return Patient Evaluation  Appointment Date: 02/12/2021  Primary Care Provider: Patient, No Pcp Per  OBGYN Clinic: Center for [facility]  Chief Complaint: follow up AUB, labs and pelvic u/s  History of Present Illness:  Katie Whitney is a 30 y.o. G1P1 (LMP: unknown) with above CC. Patient initially seen by me on 2/25 for AUB.  Patient states that her period started about two months after her child was born in late August 2020 and that she only did formula feeding. She was given OCPs for [facility] but has never been on anything for [facility] since delivery.  Her periods were regular, monthly and somewhat heavy and painful and lasted about a week but starting in December 2021 her periods became irregular and started to occur more than once a month for a few days to a few weeks  She went to UC on 2/13 and they gave her qday provera 10mg  and she finished her last pill today with her last bleeding on 2/18; cbc was normal. Pap negative 2020  At that 2/25 visit, labs and u/s were ordered and I recommended she keep on the provera until her next visit  Interval History: Since that time, she states that her bleeding stopped yesterday. Her TSH, a1c and hcg were all negative. Her u/s showed a possible solid focal 1.4 x 1 x 0.6cm lesion in the uterus.   Review of Systems:  as noted in the History of Present Illness.  Patient Active Problem List   Diagnosis Date Noted  . Abnormal uterine bleeding (AUB) 01/12/2021  . Dysmenorrhea 01/12/2021  . Excessive bleeding in premenopausal period 01/12/2021  . Language barrier 07/24/2019  . Eczema 07/24/2019   Medications:  Jalessa Peyser had no medications administered during this visit. Current Outpatient Medications  Medication Sig Dispense Refill  . medroxyPROGESTERone (PROVERA) 10 MG tablet Take 1 tablet (10 mg total) by mouth daily. 30 tablet 1  . meloxicam (MOBIC) 15 MG tablet Take 15 mg by mouth daily.  (Patient not taking: Reported on 02/12/2021)     No current facility-administered medications for this visit.    Allergies: has No Known Allergies.  Physical Exam:  BP 125/84   Pulse 96   Wt 200 lb (90.7 kg)   LMP 02/02/2021 (Exact Date)   BMI 37.79 kg/m  Body mass index is 37.79 kg/m. General appearance: Well nourished, well developed female in no acute distress.  Abdomen: diffusely non tender to palpation, non distended, and no masses, hernias Neuro/Psych:  Normal mood and affect.   Labs:  Radiology: Narrative & Impression  CLINICAL DATA:  Abnormal uterine bleeding  EXAM: TRANSABDOMINAL AND TRANSVAGINAL ULTRASOUND OF PELVIS  TECHNIQUE: Both transabdominal and transvaginal ultrasound examinations of the pelvis were performed. Transabdominal technique was performed for global imaging of the pelvis including uterus, ovaries, adnexal regions, and pelvic cul-de-sac. It was necessary to proceed with endovaginal exam following the transabdominal exam to visualize the uterus, endometrium, ovaries and adnexa.  COMPARISON:  None  FINDINGS: Uterus  Measurements: 7.9 x 4.5 x 5.4 cm = volume: 102 mL. Diffuse heterogeneous appearance of the myometrium. No visible focal measurable fibroid.  Endometrium  Thickness: 13 mm in thickness. Heterogeneous appearance. Question solid focal endometrial lesion measuring 1.4 x 1.0 x 0.6 cm.  Right ovary  Measurements: 4.1 x 2.6 x 3.9 cm = volume: 22 mL. 3.3 cm simple appearing cyst.  Left ovary  Measurements: 2.9 x 1.7 x 1.8 cm = volume: 4.8 mL. No ovarian  mass. 2.3 cm simple appearing cyst in the adnexa, likely para ovarian cyst.  Other findings  No abnormal free fluid.  IMPRESSION: Heterogeneous appearance of the uterus which can be seen with adenomyosis.  Heterogeneous endometrium. Concern for possible focal solid endometrial lesion measuring up to 1.4 cm. Consider further evaluation with sonohysterogram  for confirmation prior to hysteroscopy. Endometrial sampling should also be considered if patient is at high risk for endometrial carcinoma. (Ref: Radiological Reasoning: Algorithmic Workup of Abnormal Vaginal Bleeding with Endovaginal Sonography and Sonohysterography. AJR 2008; 829:H37-16)  3.3 cm simple appearing right ovarian cyst. 2.3 cm left para ovarian cyst. No follow up imaging recommended. Note: This recommendation does not apply to premenarchal patients or to those with increased risk (genetic, family history, elevated tumor markers or other high-risk factors) of ovarian cancer. Reference: Radiology 2019 Nov; 293(2):359-371.   Electronically Signed   By: Charlett Nose M.D.   On: 01/26/2021 10:41    Assessment: pt stable  Plan: Discussed with her re: u/s findings and her s/s. She is not interested in birth control. I told her I recommend looking inside the uterus to see if there is anything present but also a D&C as this could help to clear out any tissue or debris that could be left over from the pregnancy and/or causing her s/s. I told her that if that does not work then we know her s/s are not something that can be treated surgically at this point and she would need to pursue some type of medical option. Surgery d/w her and in terms of recovery, etc.; she is amenable to proceeding with hysteroscopy, d&c and possible myosure  Request sent to OR  Interpreter used RTC: post op  Cornelia Copa MD Attending Center for [employer] [facility])

## 2021-02-13 DIAGNOSIS — R935 Abnormal findings on diagnostic imaging of other abdominal regions, including retroperitoneum: Secondary | ICD-10-CM | POA: Insufficient documentation

## 2021-02-14 ENCOUNTER — Encounter (HOSPITAL_BASED_OUTPATIENT_CLINIC_OR_DEPARTMENT_OTHER): Payer: Self-pay | Admitting: Obstetrics and Gynecology

## 2021-02-14 ENCOUNTER — Other Ambulatory Visit: Payer: Self-pay

## 2021-02-14 ENCOUNTER — Other Ambulatory Visit: Payer: Self-pay | Admitting: Obstetrics and Gynecology

## 2021-02-14 ENCOUNTER — Encounter: Payer: Self-pay | Admitting: *Deleted

## 2021-02-14 ENCOUNTER — Telehealth: Payer: Self-pay | Admitting: *Deleted

## 2021-02-14 NOTE — Telephone Encounter (Signed)
Telephone call to patient with Katie Whitney, Reema ID [phone]. Advised surgery scheduled on Wednesday, 02-21-21 at 0945, arrive 0745. Address and phone number for [facility] is provided. Advised will receive call from hospital and letter in the mail.  Encounter closed.

## 2021-02-19 ENCOUNTER — Other Ambulatory Visit (HOSPITAL_COMMUNITY)
Admission: RE | Admit: 2021-02-19 | Discharge: 2021-02-19 | Disposition: A | Discharge status: Home / Self Care | Payer: Medicaid Other | Source: Ambulatory Visit | Attending: Obstetrics and Gynecology | Admitting: Obstetrics and Gynecology

## 2021-02-19 DIAGNOSIS — Z20822 Contact with and (suspected) exposure to covid-19: Secondary | ICD-10-CM | POA: Insufficient documentation

## 2021-02-19 DIAGNOSIS — Z01812 Encounter for preprocedural laboratory examination: Secondary | ICD-10-CM | POA: Diagnosis not present

## 2021-02-20 LAB — SARS CORONAVIRUS 2 (TAT 6-24 HRS): SARS Coronavirus 2: NEGATIVE

## 2021-02-21 ENCOUNTER — Ambulatory Visit (HOSPITAL_BASED_OUTPATIENT_CLINIC_OR_DEPARTMENT_OTHER)
Admission: RE | Admit: 2021-02-21 | Discharge: 2021-02-21 | Disposition: A | Discharge status: Home / Self Care | Payer: Medicaid Other | Attending: Obstetrics and Gynecology | Admitting: Obstetrics and Gynecology

## 2021-02-21 ENCOUNTER — Ambulatory Visit (HOSPITAL_BASED_OUTPATIENT_CLINIC_OR_DEPARTMENT_OTHER): Payer: Medicaid Other | Admitting: Anesthesiology

## 2021-02-21 ENCOUNTER — Encounter: Payer: Self-pay | Admitting: Obstetrics and Gynecology

## 2021-02-21 ENCOUNTER — Encounter (HOSPITAL_BASED_OUTPATIENT_CLINIC_OR_DEPARTMENT_OTHER): Payer: Self-pay | Admitting: Obstetrics and Gynecology

## 2021-02-21 ENCOUNTER — Other Ambulatory Visit: Payer: Self-pay

## 2021-02-21 ENCOUNTER — Encounter (HOSPITAL_BASED_OUTPATIENT_CLINIC_OR_DEPARTMENT_OTHER)
Admission: RE | Disposition: A | Discharge status: Home / Self Care | Payer: Self-pay | Source: Home / Self Care | Attending: Obstetrics and Gynecology

## 2021-02-21 DIAGNOSIS — R935 Abnormal findings on diagnostic imaging of other abdominal regions, including retroperitoneum: Secondary | ICD-10-CM

## 2021-02-21 DIAGNOSIS — N83201 Unspecified ovarian cyst, right side: Secondary | ICD-10-CM | POA: Insufficient documentation

## 2021-02-21 DIAGNOSIS — N939 Abnormal uterine and vaginal bleeding, unspecified: Secondary | ICD-10-CM | POA: Diagnosis not present

## 2021-02-21 DIAGNOSIS — N840 Polyp of corpus uteri: Secondary | ICD-10-CM | POA: Insufficient documentation

## 2021-02-21 DIAGNOSIS — Z9889 Other specified postprocedural states: Secondary | ICD-10-CM

## 2021-02-21 HISTORY — PX: DILATATION & CURETTAGE/HYSTEROSCOPY WITH MYOSURE: SHX6511

## 2021-02-21 LAB — POCT PREGNANCY, URINE: Preg Test, Ur: NEGATIVE

## 2021-02-21 SURGERY — DILATATION & CURETTAGE/HYSTEROSCOPY WITH MYOSURE
Anesthesia: General | Site: Uterus

## 2021-02-21 MED ORDER — LIDOCAINE HCL (PF) 1 % IJ SOLN
INTRAMUSCULAR | Status: AC
  Filled 2021-02-21: qty 30

## 2021-02-21 MED ORDER — FENTANYL CITRATE (PF) 100 MCG/2ML IJ SOLN: 25.0000 ug | INTRAMUSCULAR | Status: DC | PRN

## 2021-02-21 MED ORDER — IBUPROFEN 600 MG PO TABS: 600.0000 mg | ORAL_TABLET | Freq: Four times a day (QID) | ORAL | 0 refills | Status: AC | PRN

## 2021-02-21 MED ORDER — PROMETHAZINE HCL 25 MG/ML IJ SOLN: 6.2500 mg | INTRAMUSCULAR | Status: DC | PRN

## 2021-02-21 MED ORDER — LIDOCAINE 2% (20 MG/ML) 5 ML SYRINGE
INTRAMUSCULAR | Status: AC
  Filled 2021-02-21: qty 5

## 2021-02-21 MED ORDER — ONDANSETRON HCL 4 MG/2ML IJ SOLN
INTRAMUSCULAR | Status: DC | PRN
  Administered 2021-02-21: 4 mg via INTRAVENOUS

## 2021-02-21 MED ORDER — LIDOCAINE HCL 1 % IJ SOLN
INTRAMUSCULAR | Status: DC | PRN
  Administered 2021-02-21: 20 mL

## 2021-02-21 MED ORDER — OXYCODONE-ACETAMINOPHEN 5-325 MG PO TABS: 1.0000 | ORAL_TABLET | Freq: Four times a day (QID) | ORAL | 0 refills | Status: AC | PRN

## 2021-02-21 MED ORDER — SODIUM CHLORIDE 0.9 % IR SOLN
Status: DC | PRN
  Administered 2021-02-21: 1

## 2021-02-21 MED ORDER — PROPOFOL 10 MG/ML IV BOLUS
INTRAVENOUS | Status: DC | PRN
  Administered 2021-02-21: 170 mg via INTRAVENOUS

## 2021-02-21 MED ORDER — DOCUSATE SODIUM 100 MG PO CAPS: 100.0000 mg | ORAL_CAPSULE | Freq: Two times a day (BID) | ORAL | 0 refills | Status: AC

## 2021-02-21 MED ORDER — SCOPOLAMINE 1 MG/3DAYS TD PT72
1.0000 | MEDICATED_PATCH | Freq: Once | TRANSDERMAL | Status: AC
  Administered 2021-02-21: 1 via TRANSDERMAL

## 2021-02-21 MED ORDER — LACTATED RINGERS IV SOLN: INTRAVENOUS | Status: DC

## 2021-02-21 MED ORDER — MIDAZOLAM HCL 2 MG/2ML IJ SOLN
INTRAMUSCULAR | Status: AC
  Filled 2021-02-21: qty 2

## 2021-02-21 MED ORDER — FENTANYL CITRATE (PF) 100 MCG/2ML IJ SOLN
INTRAMUSCULAR | Status: AC
  Filled 2021-02-21: qty 2

## 2021-02-21 MED ORDER — KETOROLAC TROMETHAMINE 30 MG/ML IJ SOLN
INTRAMUSCULAR | Status: DC | PRN
  Administered 2021-02-21: 30 mg via INTRAVENOUS

## 2021-02-21 MED ORDER — BUPIVACAINE HCL (PF) 0.5 % IJ SOLN
INTRAMUSCULAR | Status: AC
  Filled 2021-02-21: qty 30

## 2021-02-21 MED ORDER — MIDAZOLAM HCL 5 MG/5ML IJ SOLN
INTRAMUSCULAR | Status: DC | PRN
  Administered 2021-02-21: 2 mg via INTRAVENOUS

## 2021-02-21 MED ORDER — FENTANYL CITRATE (PF) 100 MCG/2ML IJ SOLN
INTRAMUSCULAR | Status: DC | PRN
  Administered 2021-02-21 (×2): 50 ug via INTRAVENOUS

## 2021-02-21 MED ORDER — LIDOCAINE 2% (20 MG/ML) 5 ML SYRINGE
INTRAMUSCULAR | Status: DC | PRN
  Administered 2021-02-21: 100 mg via INTRAVENOUS

## 2021-02-21 MED ORDER — ACETAMINOPHEN 500 MG PO TABS
1000.0000 mg | ORAL_TABLET | Freq: Once | ORAL | Status: AC
  Administered 2021-02-21: 1000 mg via ORAL

## 2021-02-21 MED ORDER — DEXAMETHASONE SODIUM PHOSPHATE 4 MG/ML IJ SOLN
INTRAMUSCULAR | Status: DC | PRN
  Administered 2021-02-21: 4 mg via INTRAVENOUS

## 2021-02-21 MED ORDER — ACETAMINOPHEN 500 MG PO TABS
ORAL_TABLET | ORAL | Status: AC
  Filled 2021-02-21: qty 2

## 2021-02-21 MED ORDER — SILVER NITRATE-POT NITRATE 75-25 % EX MISC
CUTANEOUS | Status: AC
  Filled 2021-02-21: qty 10

## 2021-02-21 MED ORDER — SILVER NITRATE-POT NITRATE 75-25 % EX MISC
CUTANEOUS | Status: DC | PRN
  Administered 2021-02-21: 1 via TOPICAL

## 2021-02-21 SURGICAL SUPPLY — 21 items
CATH ROBINSON RED A/P 16FR (CATHETERS) IMPLANT
DEVICE MYOSURE LITE (MISCELLANEOUS) ×3 IMPLANT
DEVICE MYOSURE REACH (MISCELLANEOUS) IMPLANT
DILATOR CANAL MILEX (MISCELLANEOUS) IMPLANT
GAUZE 4X4 16PLY RFD (DISPOSABLE) IMPLANT
GLOVE SURG POLYISO LF SZ7 (GLOVE) ×3 IMPLANT
GLOVE SURG UNDER POLY LF SZ7 (GLOVE) ×3 IMPLANT
GLOVE SURG UNDER POLY LF SZ7.5 (GLOVE) ×3 IMPLANT
GOWN STRL REUS W/TWL LRG LVL3 (GOWN DISPOSABLE) ×3 IMPLANT
GOWN STRL REUS W/TWL XL LVL3 (GOWN DISPOSABLE) ×3 IMPLANT
KIT PROCEDURE FLUENT (KITS) ×3 IMPLANT
MYOSURE XL FIBROID (MISCELLANEOUS)
PACK VAGINAL MINOR WOMEN LF (CUSTOM PROCEDURE TRAY) ×3 IMPLANT
PAD OB MATERNITY 4.3X12.25 (PERSONAL CARE ITEMS) ×3 IMPLANT
PAD PREP 24X48 CUFFED NSTRL (MISCELLANEOUS) ×3 IMPLANT
SEAL ROD LENS SCOPE MYOSURE (ABLATOR) ×3 IMPLANT
SLEEVE SCD COMPRESS KNEE MED (STOCKING) ×3 IMPLANT
SUT VIC AB 2-0 SH 27 (SUTURE)
SUT VIC AB 2-0 SH 27XBRD (SUTURE) IMPLANT
SYSTEM TISS REMOVAL MYOSURE XL (MISCELLANEOUS) IMPLANT
TOWEL GREEN STERILE FF (TOWEL DISPOSABLE) ×6 IMPLANT

## 2021-02-21 NOTE — H&P (Signed)
Obstetrics and Gynecology Visit Pre Op H&P  Surgery Date: 4/6//2022  Primary Care Provider: Patient, No Pcp Per (Inactive)  OBGYN Clinic: Center for [facility]  Chief Complaint: scheduled hysteroscopy  History of Present Illness:  Katie Whitney is a 30 y.o. G1P1 (LMP: unknown) with above CC. Patient initially seen by me on 2/25 for AUB.  Patient states that her period started about two months after her child was born in late August 2020 and that she only did formula feeding. She was given OCPs for [facility] but has never been on anything for [facility] since delivery.  Her periods were regular, monthly and somewhat heavy and painful and lasted about a week but starting in December 2021 her periods became irregular and started to occur more than once a month for a few days to a few weeks  She went to UC on 2/13 and they gave her qday provera 10mg  and she finished her last pill today with her last bleeding on 2/18; cbc was normal. Pap negative 2020  At that 2/25 visit, labs and u/s were ordered and I recommended she keep on the provera until her next visit  Since that time, she states that her bleeding stopped yesterday. Her TSH, a1c and hcg were all negative. Her u/s showed a possible solid focal 1.4 x 1 x 0.6cm lesion in the uterus.   Interval History: patient seen by me on 3/28 and options d/w her and I recommend a hysteroscopy and possible d&c which she is amenable to and scheduled for today.  S/s stable  Review of Systems:  as noted in the History of Present Illness.  Patient Active Problem List   Diagnosis Date Noted  . Abnormal ultrasound of uterus 02/13/2021  . Abnormal uterine bleeding (AUB) 01/12/2021  . Dysmenorrhea 01/12/2021  . Excessive bleeding in premenopausal period 01/12/2021  . Language barrier 07/24/2019  . Eczema 07/24/2019   Medications:  Trey Bebee had no medications administered during this visit. Current Facility-Administered Medications   Medication Dose Route Frequency Provider Last Rate Last Admin  . lactated ringers infusion   Intravenous Continuous Seymour Bars, MD 10 mL/hr at 02/21/21 0822 New Bag at 02/21/21 04/23/21  . lactated ringers infusion   Intravenous Continuous 4656, MD      . scopolamine (TRANSDERM-SCOP) 1 MG/3DAYS 1.5 mg  1 patch Transdermal Once [place] Bing, MD        Allergies: has No Known Allergies.  Physical Exam:  BP 127/75   Pulse 93   Temp 98.9 F (37.2 C) (Oral)   Resp 16   Ht 5\' 1"  (1.549 m)   Wt 89.7 kg   LMP 02/02/2021 (Exact Date)   SpO2 100%   BMI 37.37 kg/m  Body mass index is 37.37 kg/m. General appearance: Well nourished, well developed female in no acute distress.  Abdomen: diffusely non tender to palpation, non distended, and no masses, hernias CV: normal s1 and s2, no MRGs Pulmonary: CTAB Neuro/Psych:  Normal mood and affect.   Labs: UPT neg COVID neg  Radiology: Narrative & Impression  CLINICAL DATA:  Abnormal uterine bleeding  EXAM: TRANSABDOMINAL AND TRANSVAGINAL ULTRASOUND OF PELVIS  TECHNIQUE: Both transabdominal and transvaginal ultrasound examinations of the pelvis were performed. Transabdominal technique was performed for global imaging of the pelvis including uterus, ovaries, adnexal regions, and pelvic cul-de-sac. It was necessary to proceed with endovaginal exam following the transabdominal exam to visualize the uterus, endometrium, ovaries and adnexa.  COMPARISON:  None  FINDINGS:  Uterus  Measurements: 7.9 x 4.5 x 5.4 cm = volume: 102 mL. Diffuse heterogeneous appearance of the myometrium. No visible focal measurable fibroid.  Endometrium  Thickness: 13 mm in thickness. Heterogeneous appearance. Question solid focal endometrial lesion measuring 1.4 x 1.0 x 0.6 cm.  Right ovary  Measurements: 4.1 x 2.6 x 3.9 cm = volume: 22 mL. 3.3 cm simple appearing cyst.  Left ovary  Measurements: 2.9 x 1.7 x 1.8 cm =  volume: 4.8 mL. No ovarian mass. 2.3 cm simple appearing cyst in the adnexa, likely para ovarian cyst.  Other findings  No abnormal free fluid.  IMPRESSION: Heterogeneous appearance of the uterus which can be seen with adenomyosis.  Heterogeneous endometrium. Concern for possible focal solid endometrial lesion measuring up to 1.4 cm. Consider further evaluation with sonohysterogram for confirmation prior to hysteroscopy. Endometrial sampling should also be considered if patient is at high risk for endometrial carcinoma. (Ref: Radiological Reasoning: Algorithmic Workup of Abnormal Vaginal Bleeding with Endovaginal Sonography and Sonohysterography. AJR 2008; 096:G83-66)  3.3 cm simple appearing right ovarian cyst. 2.3 cm left para ovarian cyst. No follow up imaging recommended. Note: This recommendation does not apply to premenarchal patients or to those with increased risk (genetic, family history, elevated tumor markers or other high-risk factors) of ovarian cancer. Reference: Radiology 2019 Nov; 293(2):359-371.   Electronically Signed   By: Charlett Nose M.D.   On: 01/26/2021 10:41    Assessment: pt stable  Plan: Patient amenable to proceeding with  Hysteroscopy, d&c, possible myosure polypectomy  Interpreter used   [place] Bing, Montez Hageman MD Attending Center for [facility])

## 2021-02-21 NOTE — Discharge Instructions (Addendum)
We will discuss your surgery once again in detail at your post-op visit in two to four weeks. If you haven't already done so, please call to make your appointment as soon as possible.  These instructions give you information on caring for yourself after your procedure. Your doctor may also give you more specific instructions. Call your doctor if you have any problems or questions after your procedure. HOME CARE  Do not drive for 24 hours.  Wait 1 week before doing any activities that wear you out.  Do not stand for a long time.  Limit stair climbing to once or twice a day.  Rest often.  Continue with your usual diet.  Drink enough fluids to keep your pee (urine) clear or pale yellow.  If you have a hard time pooping (constipation), you may:  Take a medicine to help you go poop (laxative) as told by your doctor.  Eat more fruit and bran.  Drink more fluids.  Take showers, not baths, for as long as told by your doctor.  Do not swim or use a hot tub until your doctor says it is okay.  Have someone with you for 1day after the procedure.  Do not douche, use tampons, or have sex (intercourse) until seen by your doctor  Only take medicines as told by your doctor. Do not take aspirin. It can cause bleeding.  Keep all doctor visits. GET HELP IF:  You have cramps or pain not helped by medicine.  You have new pain in the belly (abdomen).  You have a bad smelling fluid coming from your vagina.  You have a rash.  You have problems with any medicine. GET HELP RIGHT AWAY IF:   You start to bleed more than a regular period.  You have a fever.  You have chest pain.  You have trouble breathing.  You feel dizzy or feel like passing out (fainting).  You pass out.  You have pain in the tops of your shoulders.  You have vaginal bleeding with or without clumps of blood (blood clots). MAKE SURE YOU:  Understand these instructions.  Will watch your condition.  Will  get help right away if you are not doing well or get worse. Document Released: 08/13/2008 Document Revised: 11/09/2013 Document Reviewed: 06/03/2013 [facility] Patient Information 2015 [place], [place]. This information is not intended to replace advice given to you by your health care provider. Make sure you discuss any questions you have with your health care provider.  May take Tylenol after 2pm, if needed.  May take NSAIDS (Ibuprofen, Motrin) after 4pm, if needed.    Post Anesthesia Home Care Instructions  Activity: Get plenty of rest for the remainder of the day. A responsible individual must stay with you for 24 hours following the procedure.  For the next 24 hours, DO NOT: -Drive a car -Advertising copywriter -Drink alcoholic beverages -Take any medication unless instructed by your physician -Make any legal decisions or sign important papers.  Meals: Start with liquid foods such as gelatin or soup. Progress to regular foods as tolerated. Avoid greasy, spicy, heavy foods. If nausea and/or vomiting occur, drink only clear liquids until the nausea and/or vomiting subsides. Call your physician if vomiting continues.  Special Instructions/Symptoms: Your throat may feel dry or sore from the anesthesia or the breathing tube placed in your throat during surgery. If this causes discomfort, gargle with warm salt water. The discomfort should disappear within 24 hours.  If you had a scopolamine patch  placed behind your ear for the management of post- operative nausea and/or vomiting:  1. The medication in the patch is effective for 72 hours, after which it should be removed.  Wrap patch in a tissue and discard in the trash. Wash hands thoroughly with soap and water. 2. You may remove the patch earlier than 72 hours if you experience unpleasant side effects which may include dry mouth, dizziness or visual disturbances. 3. Avoid touching the patch. Wash your hands with soap and water after contact with  the patch.

## 2021-02-21 NOTE — Anesthesia Preprocedure Evaluation (Addendum)
Anesthesia Evaluation  Patient identified by MRN, date of birth, ID band Patient awake    Reviewed: Allergy & Precautions, NPO status , Patient's Chart, lab work & pertinent test results  Airway Mallampati: II  TM Distance: >3 FB Neck ROM: Full    Dental  (+) Teeth Intact, Dental Advisory Given   Pulmonary neg pulmonary ROS,    Pulmonary exam normal breath sounds clear to auscultation       Cardiovascular negative cardio ROS Normal cardiovascular exam Rhythm:Regular Rate:Normal     Neuro/Psych negative neurological ROS  negative psych ROS   GI/Hepatic negative GI ROS, Neg liver ROS,   Endo/Other  Obesity   Renal/GU negative Renal ROS     Musculoskeletal negative musculoskeletal ROS (+)   Abdominal   Peds  Hematology negative hematology ROS (+)   Anesthesia Other Findings Day of surgery medications reviewed with the patient.  Reproductive/Obstetrics AUB                            Anesthesia Physical Anesthesia Plan  ASA: II  Anesthesia Plan: General   Post-op Pain Management:    Induction: Intravenous  PONV Risk Score and Plan: 4 or greater and Midazolam, Dexamethasone, Ondansetron and Scopolamine patch - Pre-op  Airway Management Planned: LMA  Additional Equipment:   Intra-op Plan:   Post-operative Plan: Extubation in OR  Informed Consent: I have reviewed the patients History and Physical, chart, labs and discussed the procedure including the risks, benefits and alternatives for the proposed anesthesia with the patient or authorized representative who has indicated his/her understanding and acceptance.     Dental advisory given and Interpreter used for interveiw  Plan Discussed with: CRNA  Anesthesia Plan Comments:         Anesthesia Quick Evaluation

## 2021-02-21 NOTE — Transfer of Care (Signed)
Immediate Anesthesia Transfer of Care Note  Patient: Katie Whitney  Procedure(s) Performed: DILATATION & CURETTAGE/HYSTEROSCOPY WITH MYOSURE POLYPECTOMY (N/A Uterus)  Patient Location: PACU  Anesthesia Type:General  Level of Consciousness: sedated  Airway & Oxygen Therapy: Patient Spontanous Breathing and Patient connected to face mask oxygen  Post-op Assessment: Report given to RN and Post -op Vital signs reviewed and stable  Post vital signs: Reviewed and stable  Last Vitals:  Vitals Value Taken Time  BP 112/67 02/21/21 1015  Temp 36.7 C 02/21/21 1015  Pulse 89 02/21/21 1017  Resp 18 02/21/21 1017  SpO2 100 % 02/21/21 1017  Vitals shown include unvalidated device data.  Last Pain:  Vitals:   02/21/21 0815  TempSrc: Oral  PainSc: 0-No pain      Patients Stated Pain Goal: 3 (02/21/21 0815)  Complications: No complications documented.

## 2021-02-21 NOTE — Anesthesia Postprocedure Evaluation (Signed)
Anesthesia Post Note  Patient: Katie Whitney  Procedure(s) Performed: DILATATION & CURETTAGE/HYSTEROSCOPY WITH MYOSURE POLYPECTOMY (N/A Uterus)     Patient location during evaluation: PACU Anesthesia Type: General Level of consciousness: awake and alert Pain management: pain level controlled Vital Signs Assessment: post-procedure vital signs reviewed and stable Respiratory status: spontaneous breathing, nonlabored ventilation, respiratory function stable and patient connected to nasal cannula oxygen Cardiovascular status: blood pressure returned to baseline and stable Postop Assessment: no apparent nausea or vomiting Anesthetic complications: no   No complications documented.  Last Vitals:  Vitals:   02/21/21 1045 02/21/21 1105  BP: 115/82 117/76  Pulse: 89 77  Resp: 19 18  Temp:  (!) 36.3 C  SpO2: 100% 99%    Last Pain:  Vitals:   02/21/21 1105  TempSrc:   PainSc: 0-No pain                 Cecile Hearing

## 2021-02-21 NOTE — Anesthesia Procedure Notes (Signed)
Procedure Name: LMA Insertion Date/Time: 02/21/2021 9:43 AM Performed by: Burna Cash, CRNA Pre-anesthesia Checklist: Patient identified, Emergency Drugs available, Suction available and Patient being monitored Patient Re-evaluated:Patient Re-evaluated prior to induction Oxygen Delivery Method: Circle system utilized Preoxygenation: Pre-oxygenation with 100% oxygen Induction Type: IV induction Ventilation: Mask ventilation without difficulty LMA: LMA inserted LMA Size: 4.0 Number of attempts: 1 Airway Equipment and Method: Bite block Placement Confirmation: positive ETCO2 Tube secured with: Tape Dental Injury: Teeth and Oropharynx as per pre-operative assessment

## 2021-02-21 NOTE — Op Note (Signed)
Operative Note   02/21/2021  PRE-OP DIAGNOSIS: Abnormal uterine bleeding. Possible intrauterine lesion   POST-OP DIAGNOSIS: Same. Diffuse endometrial polyps   SURGEON: Surgeon(s) and Role:    * [place] Bing, MD - Primary  ASSISTANT: None  PROCEDURE: Hysteroscopy, Myosure polypectomy, Dilation and Curettage  ANESTHESIA: General and paracervical block  ESTIMATED BLOOD LOSS: 60mL  DRAINS: None   TOTAL IV FLUIDS: per anesthesia note  SPECIMENS: endometrial polyps  VTE PROPHYLAXIS: SCDs to the bilateral lower extremities  ANTIBIOTICS: None  FLUID DEFICIT:  COMPLICATIONS: None  DISPOSITION: PACU - hemodynamically stable.  CONDITION: stable  FINDINGS: Exam under anesthesia revealed small, mobile 6-8wk sized uterus with no masses and bilateral adnexa without masses or fullness. Hysteroscopy revealed an atrophic appearing uterine cavity but with diffuse endometrial polyps, particularly in the posterior mid portion, but some were also noted near the right tubal ostia and on the anterior surface, with normal bilateral tubal ostia and normal appearing endocervical canal.  Clean cavity at the end of the procedure.   PROCEDURE IN DETAIL:  After informed consent was obtained, the patient was taken to the operating room where anesthesia was obtained without difficulty. The patient was positioned in the dorsal lithotomy position in [place] stirrups.  The patient's bladder was catheterized with an in and out foley catheter.  The patient was examined under anesthesia, with the above noted findings.  The bi-valved speculum was placed inside the patient's vagina, and the the anterior lip of the cervix was seen and grasped with the tenaculum.  A paracervical block was achieved with 53mL 1% lidocaine.  The uterine cavity was sounded to 8cm, and then the cervix was progressively dilated to a 17 French-Pratt dilator.  The hysteroscope was introduced, with the above noted findings. A Myosure  polypectomy was done and then the hysteroscope removed and then a gentle curettage also done.  Excellent hemostasis was noted, and all instruments were removed, with excellent hemostasis noted throughout.  She was then taken out of dorsal lithotomy. The patient tolerated the procedure well.  Sponge, lap and instrument counts were correct x2.  The patient was taken to recovery room in excellent condition.  Cornelia Copa MD Attending [facility] for [facility])

## 2021-02-22 ENCOUNTER — Encounter (HOSPITAL_BASED_OUTPATIENT_CLINIC_OR_DEPARTMENT_OTHER): Payer: Self-pay | Admitting: Obstetrics and Gynecology

## 2021-02-22 LAB — SURGICAL PATHOLOGY

## 2021-03-01 ENCOUNTER — Other Ambulatory Visit: Payer: Self-pay | Admitting: Obstetrics and Gynecology

## 2021-03-05 ENCOUNTER — Other Ambulatory Visit: Payer: Self-pay | Admitting: Obstetrics and Gynecology

## 2021-03-09 ENCOUNTER — Ambulatory Visit: Payer: Medicaid Other | Admitting: Obstetrics and Gynecology

## 2021-03-26 ENCOUNTER — Ambulatory Visit (INDEPENDENT_AMBULATORY_CARE_PROVIDER_SITE_OTHER): Payer: Medicaid Other | Admitting: Obstetrics and Gynecology

## 2021-03-26 ENCOUNTER — Encounter: Payer: Self-pay | Admitting: Obstetrics and Gynecology

## 2021-03-26 ENCOUNTER — Other Ambulatory Visit: Payer: Self-pay

## 2021-03-26 VITALS — BP 133/77 | HR 86 | Ht 61.0 in | Wt 199.7 lb

## 2021-03-26 DIAGNOSIS — Z09 Encounter for follow-up examination after completed treatment for conditions other than malignant neoplasm: Secondary | ICD-10-CM

## 2021-03-26 NOTE — Progress Notes (Signed)
[facility] 03/26/2021  CC: scheduled post op visit  Subjective:     Katie Whitney presents s/p 4/6 hysteroscopy, myosure polypectomy, d&c for AUB and possible intrauterine lesion. Surgical findings showed diffuse endometrial polyps that were benign.  No issues or bleeding except for her LMP which was on 5/4 and has already stopped.  Objective:    BP 133/77   Pulse 86   Ht 5\' 1"  (1.549 m)   Wt 199 lb 11.2 oz (90.6 kg)   LMP 03/21/2021 (Exact Date) Comment: D&C  BMI 37.73 kg/m   NAD Abdomen: soft, nttp, nd Assessment:    Doing well postoperatively.    Plan:   Findings d/w her and reassuring given her s/s. She may want a child soon. I told her that being on hormones may prevent this from occurring in the future but if she wants to hold off since it's the first time she's had this as an issue then that's fine and that it was likely due to having a baby.  She confirms she has been off the megace that was being used for the AUB and I told her that she doesn't need that unless the AUB comes back and if it does to call 05/21/2021 for another u/s.   Interpreter used  [place], [place] Bing MD Attending [facility] [employer])

## 2021-04-06 ENCOUNTER — Other Ambulatory Visit: Payer: Self-pay | Admitting: Obstetrics and Gynecology

## 2021-04-17 ENCOUNTER — Other Ambulatory Visit: Payer: Self-pay

## 2021-04-17 DIAGNOSIS — N939 Abnormal uterine and vaginal bleeding, unspecified: Secondary | ICD-10-CM

## 2021-04-17 MED ORDER — MEDROXYPROGESTERONE ACETATE 10 MG PO TABS: 10.0000 mg | ORAL_TABLET | Freq: Every day | ORAL | 1 refills | Status: AC

## 2022-05-26 IMAGING — US US PELVIS COMPLETE WITH TRANSVAGINAL
1 series · 14 of 25 positions shown · non-contrast
Comparison: None

CLINICAL DATA: Abnormal uterine bleeding



[Series 1: us pelvis complete with transvaginal · 123 acquisitions, 14 frames shown]
[im 1/123]
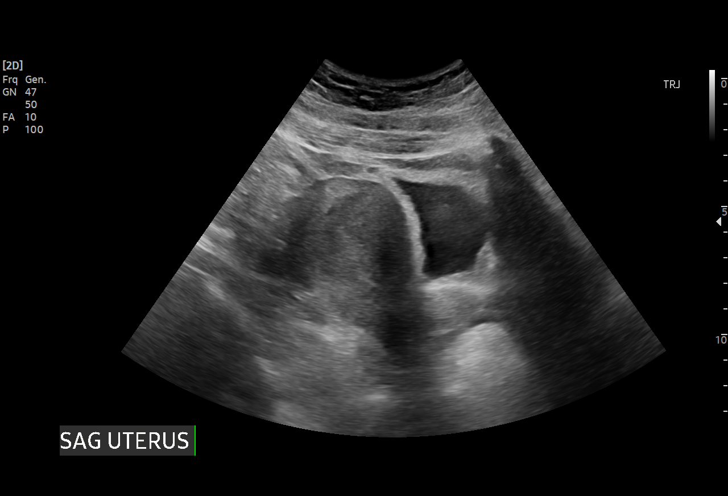
[im 11/123]
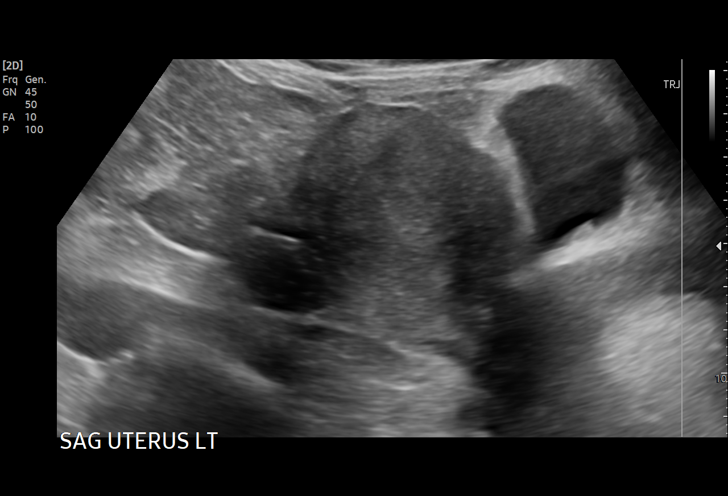
[im 21/123]
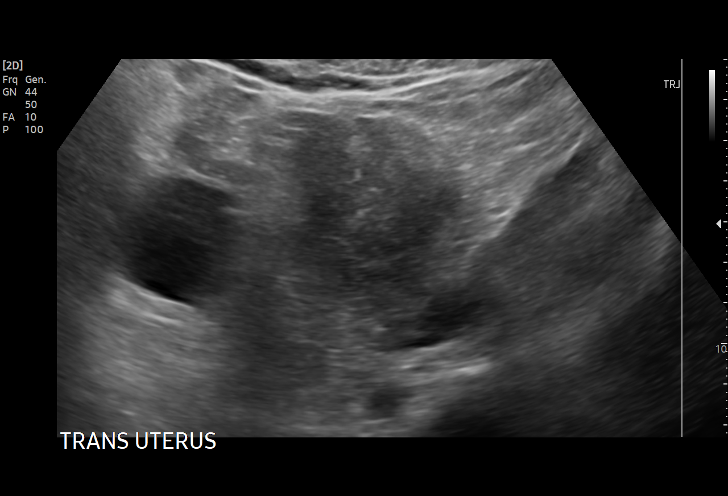
[im 31/123]
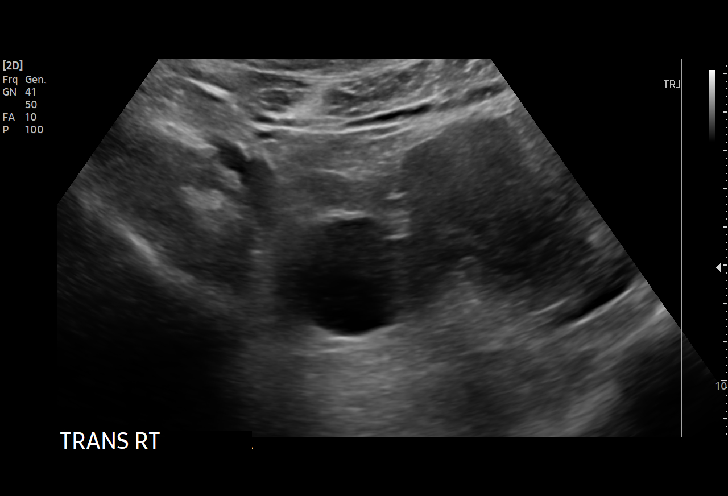
[im 41/123]
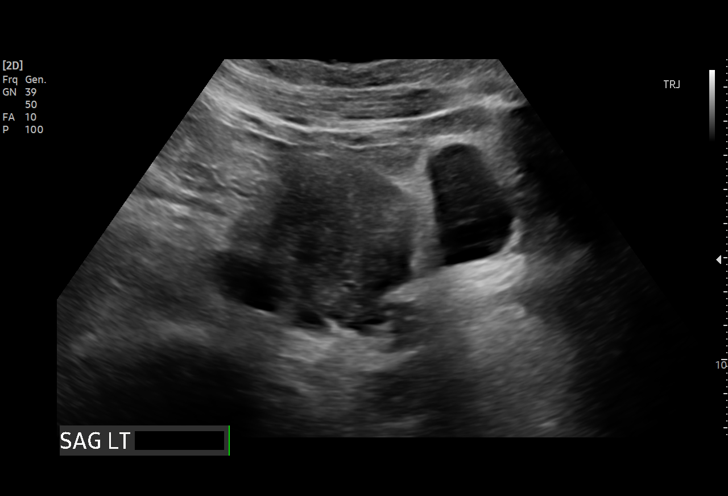
[im 46/123]
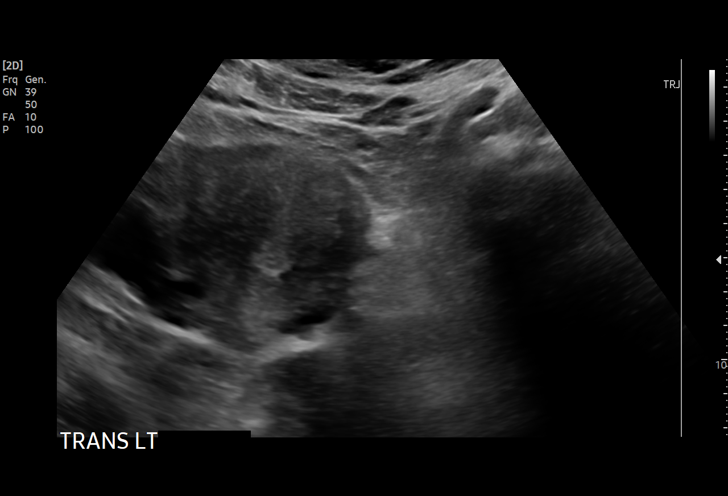
[im 56/123]
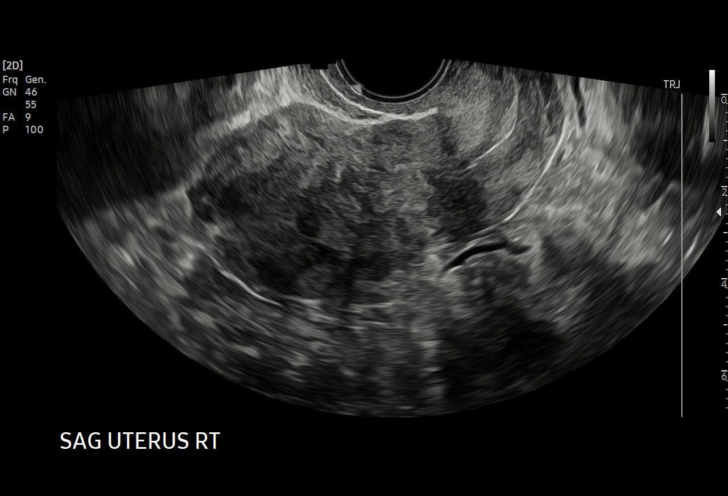
[im 67/123]
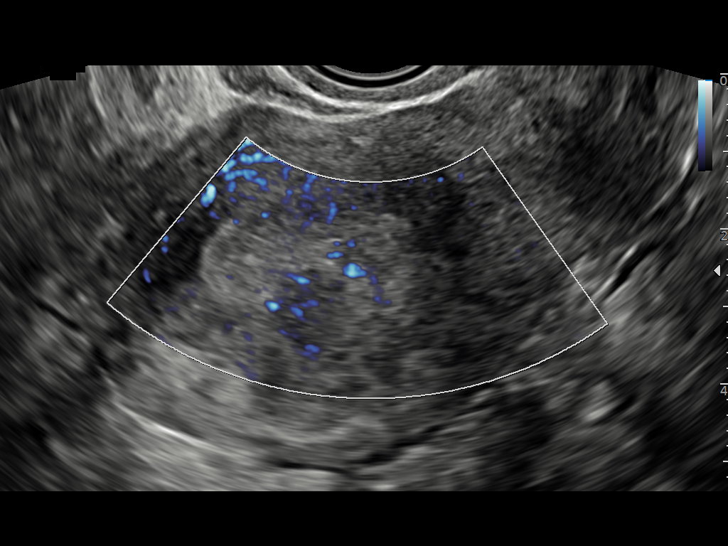
[im 77/123]
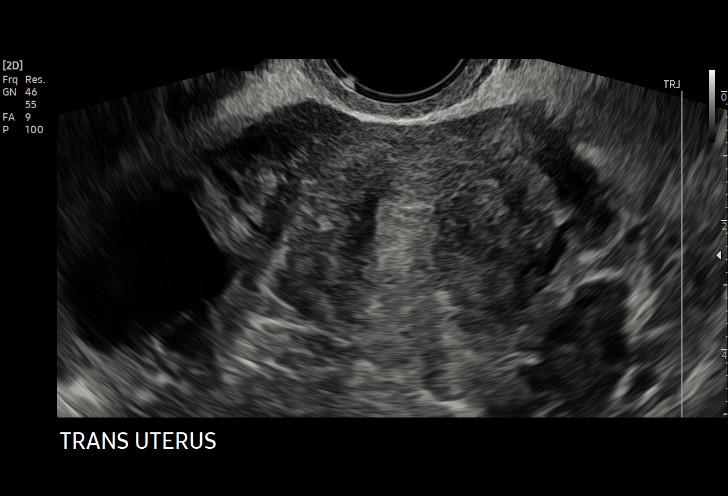
[im 82/123]
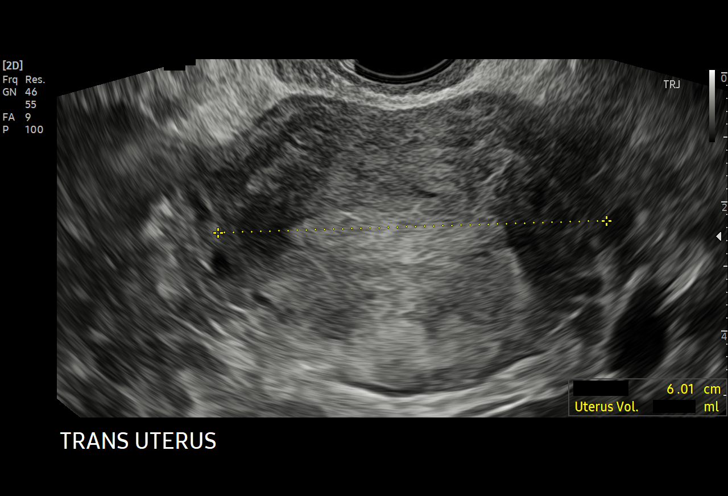
[im 92/123]
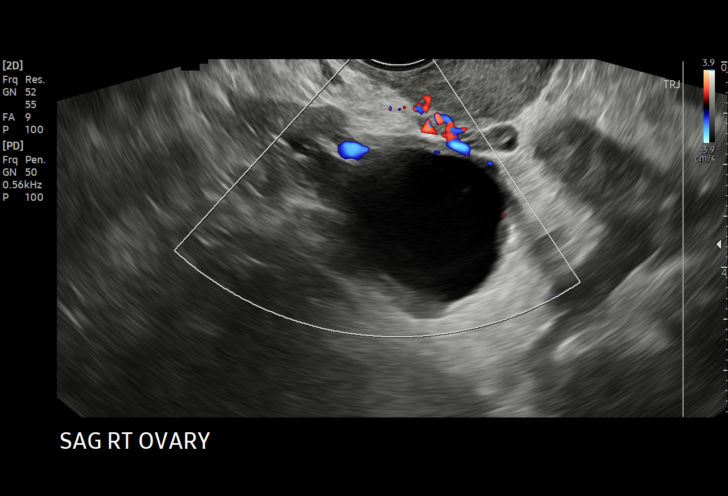
[im 102/123]
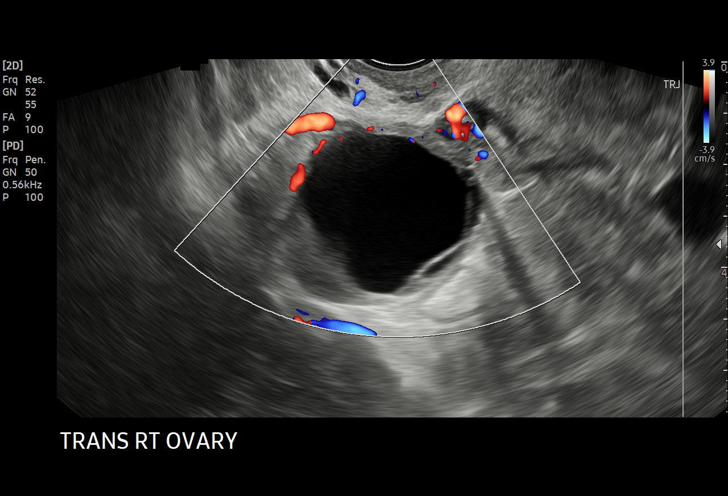
[im 112/123]
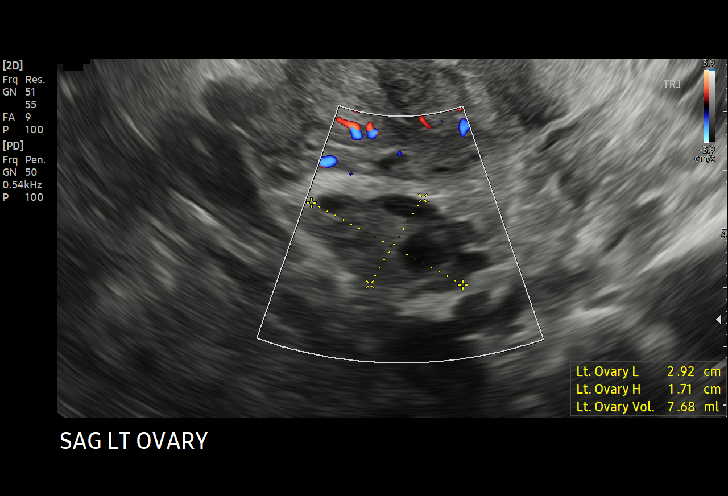
[im 123/123]
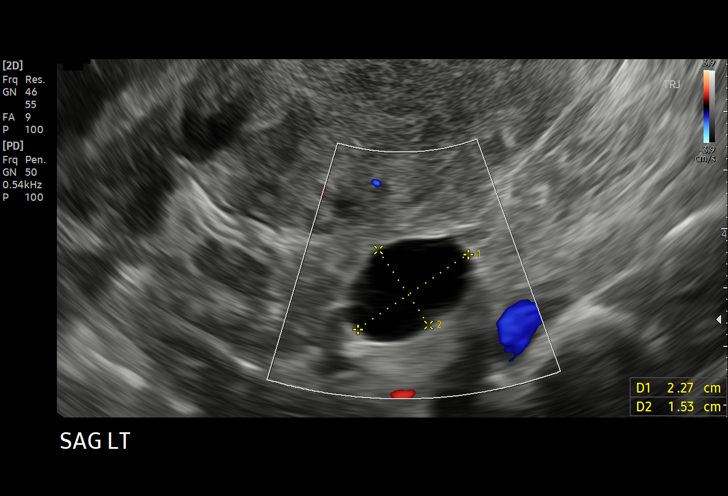

[14 of 25 positions shown; findings below may reference images not displayed]

FINDINGS: Uterus

Measurements: 7.9 x 4.5 x 5.4 cm = volume: 102 mL. Diffuse
heterogeneous appearance of the myometrium. No visible focal
measurable fibroid.

Endometrium

Thickness: 13 mm in thickness. Heterogeneous appearance. Question
solid focal endometrial lesion measuring 1.4 x 1.0 x 0.6 cm.

Right ovary

Measurements: 4.1 x 2.6 x 3.9 cm = volume: 22 mL. 3.3 cm simple
appearing cyst.

Left ovary

Measurements: 2.9 x 1.7 x 1.8 cm = volume: 4.8 mL. No ovarian mass.
2.3 cm simple appearing cyst in the adnexa, likely para ovarian
cyst.

Other findings

No abnormal free fluid.
IMPRESSION: Heterogeneous appearance of the uterus which can be seen with
adenomyosis.

Heterogeneous endometrium. Concern for possible focal solid
endometrial lesion measuring up to 1.4 cm. Consider further
evaluation with sonohysterogram for confirmation prior to
hysteroscopy. Endometrial sampling should also be considered if
patient is at high risk for endometrial carcinoma. (Ref:
Radiological Reasoning: Algorithmic Workup of Abnormal Vaginal
Bleeding with Endovaginal Sonography and Sonohysterography. AJR
[id number]; 191:S68-73)

3.3 cm simple appearing right ovarian cyst. 2.3 cm left para ovarian
cyst. No follow up imaging recommended. Note: This recommendation
does not apply to premenarchal patients or to those with increased
risk (genetic, family history, elevated tumor markers or other
high-risk factors) of ovarian cancer. Reference: Radiology [DATE];

## 2024-03-16 ENCOUNTER — Ambulatory Visit: Admitting: Certified Nurse Midwife

## 2024-03-16 NOTE — Progress Notes (Signed)
 Patient did not attend appointment. May reschedule.  Raford Bunk, MSN, CNM, RNC-OB Certified Nurse Midwife, [facility] 03/16/2024 1:54 PM
